# Patient Record
Sex: Female | Born: 1950 | Race: Black or African American | Hispanic: No | Marital: Married | Smoking: Former smoker
Health system: Southern US, Community
[De-identification: ages and names within clinical notes are randomized; demographics above are authoritative.]

## PROBLEM LIST (undated history)

## (undated) DIAGNOSIS — C50912 Malignant neoplasm of unspecified site of left female breast: Secondary | ICD-10-CM

## (undated) DIAGNOSIS — F609 Personality disorder, unspecified: Secondary | ICD-10-CM

## (undated) DIAGNOSIS — G8929 Other chronic pain: Secondary | ICD-10-CM

## (undated) DIAGNOSIS — M549 Dorsalgia, unspecified: Secondary | ICD-10-CM

## (undated) DIAGNOSIS — I10 Essential (primary) hypertension: Secondary | ICD-10-CM

## (undated) HISTORY — PX: CHOLECYSTECTOMY: SHX55

## (undated) HISTORY — PX: ABDOMINAL HYSTERECTOMY: SHX81

## (undated) HISTORY — PX: OTHER SURGICAL HISTORY: SHX169

## (undated) HISTORY — PX: BACK SURGERY: SHX140

---

## 2013-04-02 ENCOUNTER — Emergency Department (HOSPITAL_BASED_OUTPATIENT_CLINIC_OR_DEPARTMENT_OTHER)
Admission: EM | Admit: 2013-04-02 | Discharge: 2013-04-02 | Disposition: A | Discharge status: Home / Self Care | Payer: Non-veteran care | Attending: Emergency Medicine | Admitting: Emergency Medicine

## 2013-04-02 ENCOUNTER — Encounter (HOSPITAL_BASED_OUTPATIENT_CLINIC_OR_DEPARTMENT_OTHER): Payer: Self-pay | Admitting: *Deleted

## 2013-04-02 DIAGNOSIS — Z87891 Personal history of nicotine dependence: Secondary | ICD-10-CM | POA: Insufficient documentation

## 2013-04-02 DIAGNOSIS — Z880 Allergy status to penicillin: Secondary | ICD-10-CM | POA: Insufficient documentation

## 2013-04-02 DIAGNOSIS — G8929 Other chronic pain: Secondary | ICD-10-CM | POA: Insufficient documentation

## 2013-04-02 DIAGNOSIS — R350 Frequency of micturition: Secondary | ICD-10-CM | POA: Insufficient documentation

## 2013-04-02 DIAGNOSIS — I10 Essential (primary) hypertension: Secondary | ICD-10-CM | POA: Insufficient documentation

## 2013-04-02 DIAGNOSIS — Z79899 Other long term (current) drug therapy: Secondary | ICD-10-CM | POA: Insufficient documentation

## 2013-04-02 DIAGNOSIS — M549 Dorsalgia, unspecified: Secondary | ICD-10-CM

## 2013-04-02 DIAGNOSIS — M545 Low back pain, unspecified: Secondary | ICD-10-CM | POA: Insufficient documentation

## 2013-04-02 DIAGNOSIS — R300 Dysuria: Secondary | ICD-10-CM | POA: Insufficient documentation

## 2013-04-02 HISTORY — DX: Other chronic pain: G89.29

## 2013-04-02 HISTORY — DX: Dorsalgia, unspecified: M54.9

## 2013-04-02 HISTORY — DX: Essential (primary) hypertension: I10

## 2013-04-02 LAB — URINALYSIS, ROUTINE W REFLEX MICROSCOPIC
Glucose, UA: NEGATIVE mg/dL
Ketones, ur: 15 mg/dL — AB
Leukocytes, UA: NEGATIVE
Protein, ur: 30 mg/dL — AB

## 2013-04-02 LAB — BASIC METABOLIC PANEL
CO2: 29 mEq/L (ref 19–32)
Calcium: 10 mg/dL (ref 8.4–10.5)
Glucose, Bld: 104 mg/dL — ABNORMAL HIGH (ref 70–99)
Potassium: 3.3 mEq/L — ABNORMAL LOW (ref 3.5–5.1)
Sodium: 142 mEq/L (ref 135–145)

## 2013-04-02 MED ORDER — HYDROCODONE-ACETAMINOPHEN 5-325 MG PO TABS
2.0000 | ORAL_TABLET | Freq: Once | ORAL | Status: AC
  Administered 2013-04-02: 2 via ORAL
  Filled 2013-04-02: qty 2

## 2013-04-02 MED ORDER — HYDROCODONE-ACETAMINOPHEN 5-325 MG PO TABS: 2.0000 | ORAL_TABLET | ORAL | Status: DC | PRN

## 2013-04-02 NOTE — ED Notes (Signed)
Pt amb to room 9 with quick steady gait in nad. Pt reports 10 days of low back pain, frequency, urgency, and pelvic "pressure". Denies fevers at home.

## 2013-04-02 NOTE — ED Provider Notes (Signed)
Medical screening examination/treatment/procedure(s) were performed by non-physician practitioner and as supervising physician I was immediately available for consultation/collaboration.   Shira Bobst, MD 04/02/13 2320 

## 2013-04-02 NOTE — ED Provider Notes (Signed)
CSN: [id number]     Arrival date & time 04/02/13  1641 History     First MD Initiated Contact with Patient 04/02/13 1700     Chief Complaint  Patient presents with  . Flank Pain   (Consider location/radiation/quality/duration/timing/severity/associated sxs/prior Treatment) Patient is a 62 y.o. female presenting with dysuria. The history is provided by the patient. No language interpreter was used.  Dysuria Pain quality:  Aching Pain severity:  Moderate Progression:  Worsening Relieved by:  Nothing Worsened by:  Nothing tried Ineffective treatments:  None tried Pt complains of low back pain.  Pt reports urinary pressure and frequency  Past Medical History  Diagnosis Date  . Chronic back pain   . Hypertension    History reviewed. No pertinent past surgical history. History reviewed. No pertinent family history. History  Substance Use Topics  . Smoking status: Former Games developer  . Smokeless tobacco: Not on file  . Alcohol Use: Not on file   OB History   Grav Para Term Preterm Abortions TAB SAB Ect Mult Living                 Review of Systems  Genitourinary: Positive for dysuria.  All other systems reviewed and are negative.    Allergies  Penicillins  Home Medications   Current Outpatient Rx  Name  Route  Sig  Dispense  Refill  . ARIPiprazole (ABILIFY) 10 MG tablet   Oral   Take 10 mg by mouth daily.         . bisacodyl (BISACODYL) 5 MG EC tablet   Oral   Take 5 mg by mouth daily as needed for constipation.         . chlorpheniramine-phenylephrine (RYNATAN) 4.5-5 MG/5ML SUSP   Oral   Take by mouth every 12 (twelve) hours as needed.         . docusate sodium (COLACE) 100 MG capsule   Oral   Take 100 mg by mouth 2 (two) times daily.         . hydrochlorothiazide (HYDRODIURIL) 25 MG tablet   Oral   Take 25 mg by mouth daily.         Marland Kitchen ibuprofen (ADVIL,MOTRIN) 800 MG tablet   Oral   Take 800 mg by mouth every 8 (eight) hours as needed for  pain.         . meclizine (ANTIVERT) 25 MG tablet   Oral   Take 12.5 mg by mouth 3 (three) times daily as needed.         Marland Kitchen oxyCODONE-acetaminophen (PERCOCET/ROXICET) 5-325 MG per tablet   Oral   Take 1 tablet by mouth every 6 (six) hours as needed for pain.         . Probiotic Product (SOLUBLE FIBER/PROBIOTICS PO)   Oral   Take 2 capsules by mouth daily.         . ranitidine (ZANTAC) 150 MG capsule   Oral   Take 150 mg by mouth every evening.         . simvastatin (ZOCOR) 20 MG tablet   Oral   Take 20 mg by mouth every evening.          BP 145/88  Pulse 78  Temp(Src) 98 F (36.7 C) (Oral)  Resp 18  Ht 5' 5.5" (1.664 m)  Wt 220 lb (99.791 kg)  BMI 36.04 kg/m2  SpO2 98% Physical Exam  Nursing note and vitals reviewed. Constitutional: She is oriented to person, place, and time. She  appears well-developed and well-nourished.  HENT:  Head: Normocephalic.  Right Ear: External ear normal.  Left Ear: External ear normal.  Eyes: Conjunctivae are normal. Pupils are equal, round, and reactive to light.  Neck: Normal range of motion. Neck supple.  Cardiovascular: Normal rate and normal heart sounds.   Pulmonary/Chest: Effort normal.  Abdominal: Soft.  Musculoskeletal: Normal range of motion.  Neurological: She is alert and oriented to person, place, and time.  Skin: Skin is warm.  Psychiatric: She has a normal mood and affect.    ED Course   Procedures (including critical care time)  Labs Reviewed  URINALYSIS, ROUTINE W REFLEX MICROSCOPIC - Abnormal; Notable for the following:    Color, Urine AMBER (*)    APPearance CLOUDY (*)    Specific Gravity, Urine 1.045 (*)    Bilirubin Urine SMALL (*)    Ketones, ur 15 (*)    Protein, ur 30 (*)    All other components within normal limits  URINE MICROSCOPIC-ADD ON - Abnormal; Notable for the following:    Squamous Epithelial / LPF FEW (*)    All other components within normal limits   No results found. 1.  Back pain     MDM  Pt has protein in urine.  Bun and creat normal.   Pt advised follow up with primary.   Pt given rx for back pain   Elson Areas, PA-C 04/02/13 2302

## 2014-03-16 HISTORY — PX: BREAST LUMPECTOMY WITH AXILLARY LYMPH NODE BIOPSY: SHX5593

## 2014-04-27 ENCOUNTER — Telehealth: Payer: Self-pay | Admitting: *Deleted

## 2014-04-27 NOTE — Telephone Encounter (Signed)
Left message for pt to return my call so I can schedule a Med Onc appt w/ her.

## 2014-04-27 NOTE — Telephone Encounter (Signed)
Pt returned my call and I confirmed 05/17/14 appt w/ pt.  Mailed before appt letter, welcoming packet, calendar and intake form to pt.  Took paperwork to HIM to create chart.

## 2014-05-07 ENCOUNTER — Encounter: Payer: Self-pay | Admitting: *Deleted

## 2014-05-07 NOTE — Progress Notes (Signed)
Completed chart, added to spreadsheet and placed in Dr. Geralyn Flash box.

## 2014-05-17 ENCOUNTER — Encounter: Payer: Self-pay | Admitting: Hematology and Oncology

## 2014-05-17 ENCOUNTER — Ambulatory Visit: Payer: Non-veteran care

## 2014-05-17 ENCOUNTER — Ambulatory Visit (HOSPITAL_BASED_OUTPATIENT_CLINIC_OR_DEPARTMENT_OTHER): Payer: Non-veteran care | Admitting: Hematology and Oncology

## 2014-05-17 VITALS — BP 149/80 | HR 74 | Temp 98.2°F | Resp 19 | Ht 65.0 in | Wt 203.4 lb

## 2014-05-17 DIAGNOSIS — C50512 Malignant neoplasm of lower-outer quadrant of left female breast: Secondary | ICD-10-CM

## 2014-05-17 DIAGNOSIS — Z170 Estrogen receptor positive status [ER+]: Secondary | ICD-10-CM

## 2014-05-17 DIAGNOSIS — C50519 Malignant neoplasm of lower-outer quadrant of unspecified female breast: Secondary | ICD-10-CM

## 2014-05-17 DIAGNOSIS — N951 Menopausal and female climacteric states: Secondary | ICD-10-CM | POA: Insufficient documentation

## 2014-05-17 NOTE — Progress Notes (Signed)
Checked in new pt with no financial concerns at this time.  Informed pt of the [facility] Schein and gave her a flyer on what they assist with.  Pt has Raquel's card for any future questions or concerns.

## 2014-05-17 NOTE — Progress Notes (Signed)
New patient intake form sent to scan.   

## 2014-05-17 NOTE — Progress Notes (Signed)
[facility] CONSULT NOTE  Patient Care Team: Niger Reid, MD as PCP - General (Internal Medicine)  CHIEF COMPLAINTS/PURPOSE OF CONSULTATION:  Newly diagnosed breast cancer  HISTORY OF PRESENTING ILLNESS:  Sandra Costa 63 y.o. 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 is here because of recent diagnosis of left breast cancer. She was being followed at the [facility] in [place]. She had a screening mammogram done 07/14/2003 showed asymmetries in the left breast that was incompletely characterized. A followup mammogram ultrasound 08/05/2013 showed a 9 mm hypoechoic mass in the upper inner quadrant of left breast. 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 In the interim she had gallstones for which she underwent cholecystectomy on 03/09/2014. Her lumpectomy was done on 03/26/2014 that revealed a tumor measuring 0.5 cm with negative surgical margins, one sentinel lymph node and 2 non-sentinel lymph nodes were negative. ER 90% PR 50% HER-2/neu negative by IHC 1+. Patient sought medical oncology and radiation oncology at the [facility] but she elected to get treatment closer to home so she is seeing [place] today. Her breast has healed very well from the surgery. She denies any new problems or concerns. She is somewhat reluctant to go on any further treatments. She wonders what her risks are without further treatment.  I reviewed her records extensively and collaborated the history with the patient.  SUMMARY OF ONCOLOGIC HISTORY:   Breast cancer of lower-outer quadrant of left female breast   01/27/2014 Mammogram 9 mm hypoechoic mass upper-inner quadrant of left breast   03/26/2014 Surgery Left breast lumpectomy 0.5 cm tumor 3 lymph nodes negative ER 90% PR 50% HER-2 negative (at [facility])    In terms of  breast cancer risk profile:  She menarched at early age of 69 and went to menopause at age unknown because she had hysterectomy  She had 2 pregnancy, her first child was born at age 65  She never received birth control pills.  She was never exposed to fertility medications or hormone replacement therapy.  She has extensive family history of Breast/GYN/GI cancer Her sister was diagnosed with breast cancer at age 42 died within one year Sr. with breast cancer diagnosed at age 55 treated with a recurrence within 2 years Mother with breast cancer diagnosed in 52s treated for brain metastases and recurrence Grandmother with breast cancer diagnosed less than age 14  MEDICAL HISTORY:  Past Medical History  Diagnosis Date  . Chronic back pain   . Hypertension     SURGICAL HISTORY: Past Surgical History  Procedure Laterality Date  . Back surgery    . Abdominal hysterectomy    . Cholecystectomy      SOCIAL HISTORY: History   Social History  . Marital Status: Married    Spouse Name: N/A    Number of Children: N/A  . Years of Education: N/A   Occupational History  . Not on file.   Social History Main Topics  . Smoking status: Former Smoker -- 0.25 packs/day    Types: Cigarettes  . Smokeless tobacco: Never Used  . Alcohol Use: No  . Drug Use: No  . Sexual Activity: Not on file   Other Topics Concern  . Not on file   Social History Narrative  . No narrative on file    FAMILY HISTORY: Family History  Problem  Relation Age of Onset  . Cancer Mother   . Cancer Sister   . Cancer Maternal Grandmother   . Cancer Sister     ALLERGIES:  is allergic to morphine and related; penicillins; sulfa antibiotics; and tape.  MEDICATIONS:  Current Outpatient Prescriptions  Medication Sig Dispense Refill  . ARIPiprazole (ABILIFY) 10 MG tablet Take 30 mg by mouth daily. Take 1/2 tab daily      . aspirin 81 MG tablet Take 81 mg by mouth daily.      . hydrochlorothiazide (HYDRODIURIL)  25 MG tablet Take 12.5 mg by mouth daily.       Marland Kitchen oxyCODONE-acetaminophen (PERCOCET/ROXICET) 5-325 MG per tablet Take 1 tablet by mouth every 6 (six) hours as needed for pain.      . potassium chloride SA (K-DUR,KLOR-CON) 20 MEQ tablet Take 20 mEq by mouth daily.      . simvastatin (ZOCOR) 20 MG tablet Take 20 mg by mouth every evening.       No current facility-administered medications for this visit.    REVIEW OF SYSTEMS:   Constitutional: Denies fevers, chills or abnormal night sweats Eyes: Denies blurriness of vision, double vision or watery eyes Ears, nose, mouth, throat, and face: Denies mucositis or sore throat Respiratory: Denies cough, dyspnea or wheezes Cardiovascular: Denies palpitation, chest discomfort or lower extremity swelling Gastrointestinal:  Denies nausea, heartburn or change in bowel habits Skin: Denies abnormal skin rashes Lymphatics: Denies new lymphadenopathy or easy bruising Neurological:Denies numbness, tingling or new weaknesses Behavioral/Psych: Mood is stable, no new changes  Breast:  Denies any palpable lumps or discharge, breasts tenderness along the surgical areas All other systems were reviewed with the patient and are negative.  PHYSICAL EXAMINATION: ECOG PERFORMANCE STATUS: 1 - Symptomatic but completely ambulatory  Filed Vitals:   05/17/14 1447  BP: 149/80  Pulse: 74  Temp: 98.2 F (36.8 C)  Resp: 19   Filed Weights   05/17/14 1447  Weight: 203 lb 6.4 oz (92.262 kg)    GENERAL:alert, no distress and comfortable SKIN: skin color, texture, turgor are normal, no rashes or significant lesions EYES: normal, conjunctiva are pink and non-injected, sclera clear OROPHARYNX:no exudate, no erythema and lips, buccal mucosa, and tongue normal  NECK: supple, thyroid normal size, non-tender, without nodularity LYMPH:  no palpable lymphadenopathy in the cervical, axillary or inguinal LUNGS: clear to auscultation and percussion with normal breathing  effort HEART: regular rate & rhythm and no murmurs and no lower extremity edema ABDOMEN:abdomen soft, non-tender and normal bowel sounds Musculoskeletal:no cyanosis of digits and no clubbing  PSYCH: alert & oriented x 3 with fluent speech NEURO: no focal motor/sensory deficits BREAST: No palpable nodules in breast. No palpable axillary or supraclavicular lymphadenopathy. The scars are well healed.  LABORATORY DATA:  I have reviewed the data as listed No results found for this basename: WBC,  HGB,  HCT,  MCV,  PLT   Lab Results  Component Value Date   NA 142 04/02/2013   K 3.3* 04/02/2013   CL 103 04/02/2013   CO2 29 04/02/2013    RADIOGRAPHIC STUDIES: I have personally reviewed the radiological reports and agreed with the findings in the report.  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 There is no indication for systemic chemotherapy but then referred her to see radiation oncology regarding the risks and benefits of undergoing radiation since she had lumpectomy. I also recommended antiestrogen therapy with either tamoxifen or aromatase inhibitors. I would like to obtain a bone density test to evaluate for osteoporosis. If she does not have osteoporosis then I would consider treating her with aromatase inhibitor. If she has significant osteoporosis then we would use tamoxifen.  We discussed the risks and benefits of anti-estrogen therapy with aromatase inhibitors. These include but not limited to insomnia, hot flashes, mood changes, vaginal dryness, bone density loss, and weight gain. Although rare, serious side effects risk of blood clots were also discussed. We  strongly believe that the benefits far outweigh the risks. Patient understands these risks and consented to starting treatment. Planned treatment duration is 5 years. Return to clinic in 2 months to discuss treatment plan. She was provided with literature on letrozole and tamoxifen. Final decision on the broad based upon the bone density test results.  We will need to discuss if she had genetic counseling and testing done at [facility]. If she did not then we will need to consult genetics counseling.     All questions were answered. The patient knows to call the clinic with any problems, questions or concerns. I spent 40 minutes counseling the patient face to face. The total time spent in the appointment was 60 minutes and more than 50% was on counseling.     Rulon Eisenmenger, MD 05/17/2014 5:44 PM

## 2014-05-17 NOTE — Assessment & Plan Note (Signed)
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 There is no indication for systemic chemotherapy but then referred her to see radiation oncology regarding the risks and benefits of undergoing radiation since she had lumpectomy. I also recommended antiestrogen therapy with either tamoxifen or aromatase inhibitors. I would like to obtain a bone density test to evaluate for osteoporosis. If she does not have osteoporosis then I would consider treating her with aromatase inhibitor. If she has significant osteoporosis then we would use tamoxifen.  We discussed the risks and benefits of anti-estrogen therapy with aromatase inhibitors. These include but not limited to insomnia, hot flashes, mood changes, vaginal dryness, bone density loss, and weight gain. Although rare, serious side effects risk of blood clots were also discussed. We strongly believe that the benefits far outweigh the risks. Patient understands these risks and consented to starting treatment. Planned treatment duration is 5 years. Return to clinic in 2 months to discuss treatment plan. She was provided with literature on letrozole and tamoxifen. Final decision on the broad based upon the bone density test results.  We will need to discuss if she had genetic counseling and testing done at [facility]. If she did not then we will need to consult genetics counseling.

## 2014-05-19 ENCOUNTER — Telehealth: Payer: Self-pay | Admitting: Hematology and Oncology

## 2014-05-19 NOTE — Telephone Encounter (Signed)
, °

## 2014-05-25 ENCOUNTER — Telehealth: Payer: Self-pay | Admitting: *Deleted

## 2014-05-25 NOTE — Telephone Encounter (Signed)
Patient's chart from [place] received. Copy to Dr. Lindi Adie, original to HIM to be scanned.

## 2014-05-27 ENCOUNTER — Other Ambulatory Visit: Payer: Non-veteran care

## 2014-05-28 ENCOUNTER — Telehealth: Payer: Self-pay

## 2014-05-28 ENCOUNTER — Encounter: Payer: Self-pay | Admitting: Radiation Oncology

## 2014-05-28 NOTE — Telephone Encounter (Signed)
[place] form is complete.  Let pt know there is part of the form she needs to complete.  She can pick up when she is at clinic on 10/7 or I can mail to her.  Pt to call to let me know.

## 2014-05-28 NOTE — Telephone Encounter (Signed)
Sent to scan

## 2014-05-28 NOTE — Telephone Encounter (Signed)
Per pt request, mailed [employer] disability form to patient.

## 2014-05-28 NOTE — Progress Notes (Signed)
Location of Breast Cancer: lower outer quadrant of left breast  Histology per Pathology Report:   03/30/14     Receptor Status: ER(90% +), PR (50% +), Her2-neu (negative)  Did patient present with symptoms (if so, please note symptoms) or was this found on screening mammography?: screening mammogram  Past/Anticipated interventions by surgeon, if any: 03/10/14 left breast biopsy, 03/26/14 - left breast lumpectomy with needle localization with sentinal lymph node biopsy at the [facility] in [place].  Past/Anticipated interventions by medical oncology, if any: antiestrogen therapy with either tamoxifen or aromatase inhibitors for five years - sees Dr. Lindi Adie.  Lymphedema issues, if any:  no  Pain issues, if any:  yes chronic pain in her lower back - rates at a 5/10.  She takes percocet 1-2 times per day.   GYN history:She menarched at early age of 53 and went to menopause at age unknown because she had hysterectomy. She had 2 pregnancys, her first child was born at age 35. She took bcp for a short period of time.    SAFETY ISSUES:  Prior radiation? no  Pacemaker/ICD? no  Possible current pregnancy?no  Is the patient on methotrexate? no  Current Complaints / other details:  Patient is here with her husband and daughter.  She is having a dexa scan on 06/08/14.    Jacqulyn Liner, RN 05/28/2014,11:36 AM

## 2014-06-02 ENCOUNTER — Ambulatory Visit
Admission: RE | Admit: 2014-06-02 | Discharge: 2014-06-02 | Disposition: A | Discharge status: Home / Self Care | Payer: Non-veteran care | Source: Ambulatory Visit | Attending: Radiation Oncology | Admitting: Radiation Oncology

## 2014-06-02 ENCOUNTER — Encounter: Payer: Self-pay | Admitting: Radiation Oncology

## 2014-06-02 VITALS — BP 140/78 | HR 78 | Temp 98.8°F | Resp 12 | Ht 64.5 in | Wt 201.9 lb

## 2014-06-02 DIAGNOSIS — I10 Essential (primary) hypertension: Secondary | ICD-10-CM | POA: Insufficient documentation

## 2014-06-02 DIAGNOSIS — C50512 Malignant neoplasm of lower-outer quadrant of left female breast: Secondary | ICD-10-CM

## 2014-06-02 DIAGNOSIS — Z170 Estrogen receptor positive status [ER+]: Secondary | ICD-10-CM | POA: Insufficient documentation

## 2014-06-02 DIAGNOSIS — Z803 Family history of malignant neoplasm of breast: Secondary | ICD-10-CM | POA: Insufficient documentation

## 2014-06-02 DIAGNOSIS — G8929 Other chronic pain: Secondary | ICD-10-CM | POA: Insufficient documentation

## 2014-06-02 DIAGNOSIS — Z87891 Personal history of nicotine dependence: Secondary | ICD-10-CM | POA: Insufficient documentation

## 2014-06-02 DIAGNOSIS — F609 Personality disorder, unspecified: Secondary | ICD-10-CM | POA: Insufficient documentation

## 2014-06-02 DIAGNOSIS — Z79899 Other long term (current) drug therapy: Secondary | ICD-10-CM | POA: Insufficient documentation

## 2014-06-02 DIAGNOSIS — M545 Low back pain: Secondary | ICD-10-CM | POA: Insufficient documentation

## 2014-06-02 DIAGNOSIS — Z7982 Long term (current) use of aspirin: Secondary | ICD-10-CM | POA: Insufficient documentation

## 2014-06-02 HISTORY — DX: Personality disorder, unspecified: F60.9

## 2014-06-02 HISTORY — DX: Malignant neoplasm of unspecified site of left female breast: C50.912

## 2014-06-02 NOTE — Progress Notes (Signed)
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 female who is een out of the courtesy of Dr. Lindi Adie for an opinion concerning radiation therapy as part of management of patient's recently diagnosed stage I left breast cancer. The patient is a veteran of [employer] and has been undergoing routine medical care at the [facility] in [place]. A screening mammogram last year showed some asymmetries and a 6 month interval followup was recommended. A repeat mammogram as well as ultrasound was performed which showed a 9 mm  hypoechoic mass in the left breast. Patient underwent biopsy which revealed a grade 3 invasive ductal carcinoma with some associated DCIS. Patient proceeded to undergo lumpectomy and sentinel node procedure on July 31 which revealed a 0.5 cm tumor with clear margins. Total of 3 benign lymph nodes were recovered at the time of the patient's sentinel node procedure. Tumor was ER and PR positive, HER-2/neu negative. Patient was seen by medical oncology who has recommended adjuvant hormonal therapy with no recommendation for chemotherapy. Patient is now seen in radiation oncology for evaluation and consideration for radiation therapy as breast conserving therapy.  PREVIOUS RADIATION THERAPY: No  PAST MEDICAL HISTORY:  has a past medical history of Chronic back pain; Hypertension; Breast cancer, left breast; and Personality disorder.    PAST SURGICAL HISTORY: Past Surgical History  Procedure Laterality Date  . Back surgery    . Abdominal hysterectomy    . Cholecystectomy    . Apendectomy    . Breast lumpectomy  with axillary lymph node biopsy Left 03/16/14    FAMILY HISTORY: family history includes Breast cancer in her maternal grandmother, mother, sister, and sister.  SOCIAL HISTORY:  reports that she quit smoking about 5 years ago. Her smoking use included Cigarettes. She has a 6.25 pack-year smoking history. She has never used smokeless tobacco. She reports that she does not drink alcohol or use illicit drugs.  ALLERGIES: Morphine and related; Penicillins; Sulfa antibiotics; and Tape  MEDICATIONS:  Current Outpatient Prescriptions  Medication Sig Dispense Refill  . ARIPiprazole (ABILIFY) 10 MG tablet Take 30 mg by mouth daily. Take 1/2 tab daily      . aspirin 81 MG tablet Take 81 mg by mouth daily.      . hydrochlorothiazide (HYDRODIURIL) 25 MG tablet Take 12.5 mg by mouth daily.       Marland Kitchen oxyCODONE-acetaminophen (PERCOCET/ROXICET) 5-325 MG per tablet Take 1 tablet by mouth every 6 (six) hours as needed for pain.      . potassium chloride SA (K-DUR,KLOR-CON) 20 MEQ tablet Take 20 mEq by mouth daily.      . simvastatin (ZOCOR) 20 MG tablet Take 20 mg by mouth every evening.       No current facility-administered medications for this encounter.    REVIEW OF SYSTEMS:  A 15 point review of systems is documented in the electronic medical record. This was obtained by the nursing staff. However, I reviewed this with the patient to discuss relevant findings and make appropriate changes.  She has some low back pain and will undergo bone scan early next week to complete her  staging workup. Patient denies any pain within left breast or axillary region since her surgery. She denies any problems with swelling in her left arm or hand. Patient's left nipple is chronically inverted.  She denies any headaches.   PHYSICAL EXAM:  height is 5' 4.5" (1.638 m) and weight is 201 lb 14.4 oz (91.581 kg). Her oral temperature is 98.8 F (37.1 C). Her blood pressure is 140/78 and her pulse is 78. Her respiration is 12.     BP 140/78  Pulse 78  Temp(Src) 98.8 F (37.1 C) (Oral)  Resp 12  Ht 5' 4.5" (1.638 m)  Wt 201 lb 14.4 oz (91.581 kg)  BMI 34.13 kg/m2  General Appearance:    Alert, cooperative, no distress, appears stated age, accompanied by her husband and daughter. The patient has somewhat of a flat affect   Head:    Normocephalic, without obvious abnormality, atraumatic  Eyes:    PERRL, conjunctiva/corneas clear, EOM's intact,         Nose:   Nares normal, septum midline, mucosa normal, no drainage    or sinus tenderness  Throat:   Lips, mucosa, and tongue normal; teeth-several missing,  gums normal  Neck:   Supple, symmetrical, trachea midline, no adenopathy;    thyroid:  no enlargement/tenderness/nodules; no carotid   bruit or JVD  Back:     Symmetric, no curvature, ROM normal, no CVA tenderness  Lungs:     Clear to auscultation bilaterally, respirations unlabored  Chest Wall:    No tenderness or deformity   Heart:    Regular rate and rhythm, S1 and S2 normal, no murmur, rub   or gallop  Breast Exam:    Right breast-No tenderness, masses, or nipple abnormality, the left breast shows a well-healed lumpectomy scar in the 2:30 position. The patient has a separate scar in the axillary area from her senitel node procedure which is also well healed. No dominant mass appreciated breast nipple discharge or bleeding. The left nipple is inverted.   Abdomen:     Soft, non-tender, bowel sounds active all four quadrants,    no masses, no organomegaly        Extremities:   Extremities normal, atraumatic, no cyanosis or edema  Pulses:   2+ and symmetric all extremities  Skin:   Skin color, texture, turgor normal, no rashes or lesions  Lymph nodes:   Cervical, supraclavicular, and axillary nodes normal  Neurologic:    normal strength, sensation and reflexes    throughout     ECOG = 0  0 - Asymptomatic (Fully active, able to carry on all predisease activities without restriction)  LABORATORY DATA:   No results found for this basename: WBC, HGB, HCT, MCV, PLT, NEUTROABS   Lab Results  Component Value Date   NA 142 04/02/2013   K 3.3* 04/02/2013   CL 103 04/02/2013   CO2 29 04/02/2013   GLUCOSE 104* 04/02/2013   CREATININE 0.80 04/02/2013   CALCIUM 10.0 04/02/2013      RADIOGRAPHY: No results found.    IMPRESSION: Stage I invasive ductal carcinoma of the left breast. Patient would be a good candidate for breast conservation with radiation therapy directed at the left breast. I discussed the treatment course side effects and potential toxicities of radiation therapy in this situation with the patient and her family. She appears to understand and wishes to proceed with planned course of treatment. The patient will proceed with hypo- fractionated accelerated treatment if technically possible.  PLAN:  Simulation and planning on October 13 with treatments to begin soon afterwards   ------------------------------------------------  Blair Promise, PhD, MD

## 2014-06-02 NOTE — Progress Notes (Signed)
Please see the Nurse Progress Note in the MD Initial Consult Encounter for this patient. 

## 2014-06-03 ENCOUNTER — Encounter: Payer: Self-pay | Admitting: *Deleted

## 2014-06-03 NOTE — Progress Notes (Signed)
[place] Psychosocial Distress Screening Clinical Social Work  Clinical Social Work was referred by distress screening protocol.  The patient scored a 8 on the Psychosocial Distress Thermometer which indicates severe distress. Clinical Social Worker phoned pt to assess for distress and other psychosocial needs. Pt not available, CSW left message and awaits return call.  ONCBCN DISTRESS SCREENING 06/02/2014  Screening Type Initial Screening  Elta Gu[place] the number that describes how much distress you have been experiencing in the past week 8  Emotional problem type Nervousness/Anxiety  Physical Problem type Getting around;Bathing/dressing  Physician notified of physical symptoms Yes   Clinical Social Worker follow up needed: No.  If yes, follow up plan: Loren Racer, [place]  [facility] Phone: [phone] Fax: [phone]

## 2014-06-07 ENCOUNTER — Ambulatory Visit
Admission: RE | Admit: 2014-06-07 | Discharge: 2014-06-07 | Disposition: A | Discharge status: Home / Self Care | Payer: Non-veteran care | Source: Ambulatory Visit | Attending: Radiation Oncology | Admitting: Radiation Oncology

## 2014-06-07 DIAGNOSIS — C50512 Malignant neoplasm of lower-outer quadrant of left female breast: Secondary | ICD-10-CM

## 2014-06-07 NOTE — Progress Notes (Signed)
  Radiation Oncology         [phone]) [phone] ________________________________  Name: Sandra Costa MRN: [id number]  Date: 06/07/2014  DOB: [birth year]  RESPIRATORY MOTION MANAGEMENT SIMULATION  NARRATIVE:  In order to account for effect of respiratory motion on target structures and other organs in the planning and delivery of radiotherapy, this patient underwent respiratory motion management simulation.  To accomplish this, when the patient was brought to the CT simulation planning suite, 4D respiratoy motion management CT images were obtained.  The CT images were loaded into the planning software.  Then, using a variety of tools including Cine, MIP, and standard views, the target volume and planning target volumes (PTV) were delineated.  Avoidance structures were contoured.  Treatment planning then occurred.  Dose volume histograms were generated and reviewed for each of the requested structure.  The resulting plan was carefully reviewed and approved today.  Blair Promise M.D.

## 2014-06-07 NOTE — Progress Notes (Signed)
  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  Identity was confirmed.  All relevant records and images related to the planned course of therapy were reviewed.  The patient freely provided informed written consent to proceed with treatment after reviewing the details related to the planned course of therapy. The consent form was witnessed and verified by the simulation staff.  Then, the patient was set-up in a stable reproducible  supine position for radiation therapy.  CT images were obtained.  Surface markings were placed.  The CT images were loaded into the planning software.  Then the target and avoidance structures were contoured.  Treatment planning then occurred.  The radiation prescription was entered and confirmed.  Then, I designed and supervised the construction of a total of 3 medically necessary complex treatment devices.  I have requested : 3D Simulation  I have requested a DVH of the following structures: heart, lungs, lumpectomy cavity.  I have ordered:dose calc.  PLAN:  The patient will receive 50.4 Gy in 28 fractions followed by a boost to the lumpectomy cavity of 10 gray in a cumulutive dose of 60.4 gray. Due to the significant separation at the  Isocenter of the breast field, the patient would not be a candidate for hypo- fractionated accelerated radiation therapy.  ________________________________  -----------------------------------  Blair Promise, PhD, MD

## 2014-06-07 NOTE — Progress Notes (Signed)
  Radiation Oncology         [phone]) [phone] ________________________________  Name: Sandra Costa MRN: [id number]  Date: 06/07/2014  DOB: 1951/08/13  Optical Surface Tracking Plan:  Since intensity modulated radiotherapy (IMRT) and 3D conformal radiation treatment methods are predicated on accurate and precise positioning for treatment, intrafraction motion monitoring is medically necessary to ensure accurate and safe treatment delivery.  The ability to quantify intrafraction motion without excessive ionizing radiation dose can only be performed with optical surface tracking. Accordingly, surface imaging offers the opportunity to obtain 3D measurements of patient position throughout IMRT and 3D treatments without excessive radiation exposure.  I am ordering optical surface tracking for this patient's upcoming course of radiotherapy. ________________________________  Blair Promise, MD 06/07/2014 6:42 PM    Reference:   Particia Jasper, et al. Surface imaging-based analysis of intrafraction motion for breast radiotherapy patients.Journal of [place], n. 6, nov. 2014. ISSN [id number].   Available at: <http://www.jacmp.org/index.php/jacmp/article/view/4957>.

## 2014-06-08 ENCOUNTER — Ambulatory Visit
Admission: RE | Admit: 2014-06-08 | Discharge: 2014-06-08 | Disposition: A | Discharge status: Home / Self Care | Payer: Non-veteran care | Source: Ambulatory Visit | Attending: Hematology and Oncology | Admitting: Hematology and Oncology

## 2014-06-08 DIAGNOSIS — N951 Menopausal and female climacteric states: Secondary | ICD-10-CM

## 2014-06-09 ENCOUNTER — Encounter: Payer: Self-pay | Admitting: Radiation Oncology

## 2014-06-14 ENCOUNTER — Ambulatory Visit
Admission: RE | Admit: 2014-06-14 | Discharge: 2014-06-14 | Disposition: A | Discharge status: Home / Self Care | Payer: Non-veteran care | Source: Ambulatory Visit | Attending: Radiation Oncology | Admitting: Radiation Oncology

## 2014-06-14 DIAGNOSIS — C50512 Malignant neoplasm of lower-outer quadrant of left female breast: Secondary | ICD-10-CM

## 2014-06-14 NOTE — Progress Notes (Signed)
  Radiation Oncology         [phone] ________________________________  Name: Sandra Costa MRN: [id number]  Date: 06/14/2014  DOB: Jan 30, 1951  Simulation Verification Note    ICD-9-CM ICD-10-CM  1. Breast cancer of lower-outer quadrant of left female breast 174.5 C50.512    Status: outpatient  NARRATIVE: The patient was brought to the treatment unit and placed in the planned treatment position. The clinical setup was verified. Then port films were obtained and uploaded to the radiation oncology medical record software.  The treatment beams were carefully compared against the planned radiation fields. The position location and shape of the radiation fields was reviewed. They targeted volume of tissue appears to be appropriately covered by the radiation beams. Organs at risk appear to be excluded as planned.  Based on my personal review, I approved the simulation verification. The patient's treatment will proceed as planned.  -----------------------------------  Blair Promise, PhD, MD

## 2014-06-15 ENCOUNTER — Encounter: Payer: Self-pay | Admitting: Radiation Oncology

## 2014-06-15 ENCOUNTER — Ambulatory Visit
Admission: RE | Admit: 2014-06-15 | Discharge: 2014-06-15 | Disposition: A | Discharge status: Home / Self Care | Payer: Non-veteran care | Source: Ambulatory Visit | Attending: Radiation Oncology | Admitting: Radiation Oncology

## 2014-06-15 VITALS — BP 121/65 | HR 75 | Temp 98.3°F | Ht 64.5 in | Wt 200.1 lb

## 2014-06-15 DIAGNOSIS — C50512 Malignant neoplasm of lower-outer quadrant of left female breast: Secondary | ICD-10-CM | POA: Insufficient documentation

## 2014-06-15 DIAGNOSIS — Z170 Estrogen receptor positive status [ER+]: Secondary | ICD-10-CM | POA: Insufficient documentation

## 2014-06-15 DIAGNOSIS — M545 Low back pain: Secondary | ICD-10-CM | POA: Insufficient documentation

## 2014-06-15 MED ORDER — ALRA NON-METALLIC DEODORANT (RAD-ONC)
1.0000 "application " | Freq: Once | TOPICAL | Status: AC
  Administered 2014-06-15: 1 via TOPICAL

## 2014-06-15 MED ORDER — RADIAPLEXRX EX GEL
Freq: Once | CUTANEOUS | Status: AC
Start: 2014-06-15 — End: 2014-06-15
  Administered 2014-06-15: 17:00:00 via TOPICAL

## 2014-06-15 NOTE — Progress Notes (Signed)
  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 . . . . . . . The patient presents for routine under treatment assessment.                                   The patient is without complaint.                                 Set-up films were reviewed.                                 The chart was checked. Physical Findings. . the lungs are clear. The heart has a regular rhythm and rate. The left breast area shows no significant radiation reaction. The patient may have a yeast infection in the sternal and inframammary fold areas Impression . . . . . . . The patient is tolerating radiation. Plan . . . . . . . . . . . . Continue treatment as planned. The patient has been given a antifungal powder to place along the sternal region and inframammary fold area.    ________________________________   Blair Promise, PhD, MD

## 2014-06-15 NOTE — Progress Notes (Signed)
Patient was given miconazole powder per Dr. Sondra Come.  She was instructed to apply it to the red/itchy area between her breast twice a day.  Sandra Costa verbalized understanding.

## 2014-06-15 NOTE — Progress Notes (Signed)
Sandra Costa has completed 1 fraction to her left breast.  She denies pain.  She is taking Percocet 2 tablets per day for lower back pain.  She reports having redness between her breast that itches.  Redness noted between her breasts.  She reports that this started a long time ago.  She was given the Radiation Therapy and You book and discussed potential side effects of fatigue and skin changes.  She was given the skin care handout, Alra Deoderant and radiaplex gel.  She was instructed to apply the radiaplex to her left breast twice a day after treatment and at bedtime.  She was encouraged to call with any questions or concerns.

## 2014-06-16 ENCOUNTER — Ambulatory Visit
Admission: RE | Admit: 2014-06-16 | Discharge: 2014-06-16 | Disposition: A | Discharge status: Home / Self Care | Payer: Non-veteran care | Source: Ambulatory Visit | Attending: Radiation Oncology | Admitting: Radiation Oncology

## 2014-06-17 ENCOUNTER — Ambulatory Visit
Admission: RE | Admit: 2014-06-17 | Discharge: 2014-06-17 | Disposition: A | Discharge status: Home / Self Care | Payer: Non-veteran care | Source: Ambulatory Visit | Attending: Radiation Oncology | Admitting: Radiation Oncology

## 2014-06-18 ENCOUNTER — Telehealth: Payer: Self-pay

## 2014-06-18 ENCOUNTER — Ambulatory Visit
Admission: RE | Admit: 2014-06-18 | Discharge: 2014-06-18 | Disposition: A | Discharge status: Home / Self Care | Payer: Non-veteran care | Source: Ambulatory Visit | Attending: Radiation Oncology | Admitting: Radiation Oncology

## 2014-06-18 NOTE — Telephone Encounter (Signed)
Bone density results received from [place].  Copy to Dr Lindi Adie.  Original to scan.

## 2014-06-18 NOTE — Telephone Encounter (Signed)
Per Dr Lindi Adie, let pt know bone density results "normal in spine, osteopenia in hip".  Dr Lindi Adie would like her to take calcium and vit d supplements and weight bearing exercise.  Pt voiced understanding.

## 2014-06-21 ENCOUNTER — Ambulatory Visit
Admission: RE | Admit: 2014-06-21 | Discharge: 2014-06-21 | Disposition: A | Discharge status: Home / Self Care | Payer: Non-veteran care | Source: Ambulatory Visit | Attending: Radiation Oncology | Admitting: Radiation Oncology

## 2014-06-22 ENCOUNTER — Encounter: Payer: Self-pay | Admitting: Radiation Oncology

## 2014-06-22 ENCOUNTER — Inpatient Hospital Stay
Admission: RE | Admit: 2014-06-22 | Discharge: 2014-06-22 | Disposition: A | Discharge status: Home / Self Care | Payer: Self-pay | Source: Ambulatory Visit | Attending: Radiation Oncology | Admitting: Radiation Oncology

## 2014-06-22 ENCOUNTER — Ambulatory Visit
Admission: RE | Admit: 2014-06-22 | Discharge: 2014-06-22 | Disposition: A | Discharge status: Home / Self Care | Payer: Non-veteran care | Source: Ambulatory Visit | Attending: Radiation Oncology | Admitting: Radiation Oncology

## 2014-06-22 VITALS — BP 123/70 | HR 79 | Temp 98.3°F | Resp 20 | Wt 202.1 lb

## 2014-06-22 DIAGNOSIS — C50512 Malignant neoplasm of lower-outer quadrant of left female breast: Secondary | ICD-10-CM

## 2014-06-22 NOTE — Progress Notes (Signed)
Weekly left breast 6 completed so far skin intact ,no skin changes noted yetm, will start using radiaplex gel today after going home, and will use bid,, patient with fklat affect, fatigued, back pain,takes percocet for that, no breast pain, appetite,  Good,  2:57 PM

## 2014-06-22 NOTE — Progress Notes (Signed)
  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 . . . . . . . The patient presents for routine under treatment assessment.                                   The patient is without complaint except for fatigue                                 Set-up films were reviewed.                                 The chart was checked. Physical Findings. . .  weight is 202 lb 1.6 oz (91.672 kg). Her oral temperature is 98.3 F (36.8 C). Her blood pressure is 123/70 and her pulse is 79. Her respiration is 20. . The lungs are clear. The heart has a regular rhythm and rate. Examination of the left breast reveals mild hyperpigmentation changes. Impression . . . . . . . The patient is tolerating radiation. Plan . . . . . . . . . . . . Continue treatment as planned.  ________________________________   Blair Promise, PhD, MD

## 2014-06-23 ENCOUNTER — Ambulatory Visit
Admission: RE | Admit: 2014-06-23 | Discharge: 2014-06-23 | Disposition: A | Discharge status: Home / Self Care | Payer: Non-veteran care | Source: Ambulatory Visit | Attending: Radiation Oncology | Admitting: Radiation Oncology

## 2014-06-24 ENCOUNTER — Ambulatory Visit
Admission: RE | Admit: 2014-06-24 | Discharge: 2014-06-24 | Disposition: A | Discharge status: Home / Self Care | Payer: Non-veteran care | Source: Ambulatory Visit | Attending: Radiation Oncology | Admitting: Radiation Oncology

## 2014-06-25 ENCOUNTER — Ambulatory Visit
Admission: RE | Admit: 2014-06-25 | Discharge: 2014-06-25 | Disposition: A | Discharge status: Home / Self Care | Payer: Non-veteran care | Source: Ambulatory Visit | Attending: Radiation Oncology | Admitting: Radiation Oncology

## 2014-06-27 ENCOUNTER — Ambulatory Visit: Payer: Non-veteran care

## 2014-06-28 ENCOUNTER — Ambulatory Visit: Payer: Non-veteran care

## 2014-06-28 ENCOUNTER — Ambulatory Visit
Admission: RE | Admit: 2014-06-28 | Discharge: 2014-06-28 | Disposition: A | Discharge status: Home / Self Care | Payer: Non-veteran care | Source: Ambulatory Visit | Attending: Radiation Oncology | Admitting: Radiation Oncology

## 2014-06-29 ENCOUNTER — Encounter: Payer: Self-pay | Admitting: Radiation Oncology

## 2014-06-29 ENCOUNTER — Ambulatory Visit: Payer: Non-veteran care

## 2014-06-29 ENCOUNTER — Ambulatory Visit
Admission: RE | Admit: 2014-06-29 | Discharge: 2014-06-29 | Disposition: A | Discharge status: Home / Self Care | Payer: Non-veteran care | Source: Ambulatory Visit | Attending: Radiation Oncology | Admitting: Radiation Oncology

## 2014-06-29 VITALS — BP 121/79 | HR 77 | Temp 98.1°F | Resp 20 | Ht 64.5 in | Wt 201.2 lb

## 2014-06-29 DIAGNOSIS — C50912 Malignant neoplasm of unspecified site of left female breast: Secondary | ICD-10-CM | POA: Diagnosis not present

## 2014-06-29 DIAGNOSIS — Z170 Estrogen receptor positive status [ER+]: Secondary | ICD-10-CM | POA: Diagnosis not present

## 2014-06-29 DIAGNOSIS — Z510 Encounter for antineoplastic radiation therapy: Secondary | ICD-10-CM | POA: Diagnosis present

## 2014-06-29 DIAGNOSIS — C50512 Malignant neoplasm of lower-outer quadrant of left female breast: Secondary | ICD-10-CM

## 2014-06-29 MED ORDER — RADIAPLEXRX EX GEL
Freq: Once | CUTANEOUS | Status: AC
  Administered 2014-06-29: 16:00:00 via TOPICAL

## 2014-06-29 NOTE — Progress Notes (Signed)
  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 . . . . . . . The patient presents for routine under treatment assessment.                                   The patient is without complaint except for some soreness along the left axillary region                                Set-up films were reviewed.                                 The chart was checked. Physical Findings. . .  height is 5' 4.5" (1.638 m) and weight is 201 lb 3.2 oz (91.264 kg). Her oral temperature is 98.1 F (36.7 C). Her blood pressure is 121/79 and her pulse is 77. Her respiration is 20. . The lungs are clear. The heart has a regular rhythm and rate. The left breast shows some hyperpigmentation changes. Impression . . . . . . . The patient is tolerating radiation. Plan . . . . . . . . . . . . Continue treatment as planned.  ________________________________   Blair Promise, PhD, MD

## 2014-06-29 NOTE — Progress Notes (Signed)
Sandra Costa has completed 11 fractions to her left breast.  She reports chronic pain in her lower back that she rates at a 4/10.  She reports fatigue but says she had it before radiation started.  She has hyperpigmentation under her left breast and under her left arm.  She reports soreness/swelling under her left arm.  She is using radiaplex gel 1-2 times per day.  She requested a refill and another tube has been given.

## 2014-06-30 ENCOUNTER — Ambulatory Visit: Payer: Non-veteran care

## 2014-06-30 ENCOUNTER — Ambulatory Visit
Admission: RE | Admit: 2014-06-30 | Discharge: 2014-06-30 | Disposition: A | Discharge status: Home / Self Care | Payer: Non-veteran care | Source: Ambulatory Visit | Attending: Radiation Oncology | Admitting: Radiation Oncology

## 2014-07-01 ENCOUNTER — Ambulatory Visit
Admission: RE | Admit: 2014-07-01 | Discharge: 2014-07-01 | Disposition: A | Discharge status: Home / Self Care | Payer: Non-veteran care | Source: Ambulatory Visit | Attending: Radiation Oncology | Admitting: Radiation Oncology

## 2014-07-01 ENCOUNTER — Ambulatory Visit: Payer: Non-veteran care

## 2014-07-02 ENCOUNTER — Ambulatory Visit: Payer: Non-veteran care

## 2014-07-02 ENCOUNTER — Ambulatory Visit
Admission: RE | Admit: 2014-07-02 | Discharge: 2014-07-02 | Disposition: A | Discharge status: Home / Self Care | Payer: Non-veteran care | Source: Ambulatory Visit | Attending: Radiation Oncology | Admitting: Radiation Oncology

## 2014-07-05 ENCOUNTER — Ambulatory Visit
Admission: RE | Admit: 2014-07-05 | Discharge: 2014-07-05 | Disposition: A | Discharge status: Home / Self Care | Payer: Non-veteran care | Source: Ambulatory Visit | Attending: Radiation Oncology | Admitting: Radiation Oncology

## 2014-07-06 ENCOUNTER — Ambulatory Visit
Admission: RE | Admit: 2014-07-06 | Discharge: 2014-07-06 | Disposition: A | Discharge status: Home / Self Care | Payer: Non-veteran care | Source: Ambulatory Visit | Attending: Radiation Oncology | Admitting: Radiation Oncology

## 2014-07-07 ENCOUNTER — Encounter: Payer: Self-pay | Admitting: Radiation Oncology

## 2014-07-07 ENCOUNTER — Ambulatory Visit
Admission: RE | Admit: 2014-07-07 | Discharge: 2014-07-07 | Disposition: A | Discharge status: Home / Self Care | Payer: Non-veteran care | Source: Ambulatory Visit | Attending: Radiation Oncology | Admitting: Radiation Oncology

## 2014-07-07 VITALS — BP 115/72 | HR 79 | Temp 98.4°F | Resp 16 | Wt 202.4 lb

## 2014-07-07 DIAGNOSIS — C50512 Malignant neoplasm of lower-outer quadrant of left female breast: Secondary | ICD-10-CM

## 2014-07-07 NOTE — Progress Notes (Signed)
  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 . . . . . . . The patient presents for routine under treatment assessment.                                   The patient is without complaint except for occasional tingling in the upper aspect of left breast                                 Set-up films were reviewed.                                 The chart was checked. Physical Findings. . .  weight is 202 lb 6.4 oz (91.808 kg). Her oral temperature is 98.4 F (36.9 C). Her blood pressure is 115/72 and her pulse is 79. Her respiration is 16. Marland Kitchen  Hyperpigmentation changes noted in the left breast but no skin breakdown. Impression . . . . . . . The patient is tolerating radiation. Plan . . . . . . . . . . . . Continue treatment as planned.  ________________________________   Blair Promise, PhD, MD

## 2014-07-07 NOTE — Progress Notes (Signed)
Weekly rad txs left breast 17 completed , slight tanning to breast,skin intact,not using her radiplex gel as yet, and stopped using powder under breast folds, occasional tingling in left breast, , appetite good, fatigued, c/o dysuria some increased frequency voiding q 2 hours,  up at night q 2 hours  2:08 PM

## 2014-07-08 ENCOUNTER — Ambulatory Visit
Admission: RE | Admit: 2014-07-08 | Discharge: 2014-07-08 | Disposition: A | Discharge status: Home / Self Care | Payer: Non-veteran care | Source: Ambulatory Visit | Attending: Radiation Oncology | Admitting: Radiation Oncology

## 2014-07-09 ENCOUNTER — Ambulatory Visit
Admission: RE | Admit: 2014-07-09 | Discharge: 2014-07-09 | Disposition: A | Discharge status: Home / Self Care | Payer: Non-veteran care | Source: Ambulatory Visit | Attending: Radiation Oncology | Admitting: Radiation Oncology

## 2014-07-11 ENCOUNTER — Encounter: Payer: Self-pay | Admitting: Radiation Oncology

## 2014-07-11 NOTE — Progress Notes (Signed)
  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 Patient's treatment planning CT scan was reviewed and she had set up of a 3 field photon boost directed at the lumpectomy cavity within the breast. The patient will be treated with 3 custom-shaped photon boost fields. A computerized isodose plan will be generated for treatment.  The patient will receive 5 additional treatments at 2 gray per fraction for a boost dose of 10 gray.  -----------------------------------  Blair Promise, PhD, MD

## 2014-07-12 ENCOUNTER — Ambulatory Visit
Admission: RE | Admit: 2014-07-12 | Discharge: 2014-07-12 | Disposition: A | Discharge status: Home / Self Care | Payer: Non-veteran care | Source: Ambulatory Visit | Attending: Radiation Oncology | Admitting: Radiation Oncology

## 2014-07-13 ENCOUNTER — Ambulatory Visit: Payer: Non-veteran care | Admitting: Hematology and Oncology

## 2014-07-13 ENCOUNTER — Ambulatory Visit
Admission: RE | Admit: 2014-07-13 | Discharge: 2014-07-13 | Disposition: A | Discharge status: Home / Self Care | Payer: Non-veteran care | Source: Ambulatory Visit | Attending: Radiation Oncology | Admitting: Radiation Oncology

## 2014-07-13 ENCOUNTER — Encounter: Payer: Self-pay | Admitting: Radiation Oncology

## 2014-07-13 VITALS — BP 129/66 | HR 77 | Temp 98.3°F | Resp 20 | Wt 205.0 lb

## 2014-07-13 DIAGNOSIS — C50512 Malignant neoplasm of lower-outer quadrant of left female breast: Secondary | ICD-10-CM

## 2014-07-13 NOTE — Progress Notes (Signed)
Patient states she has chronic back pain, takes Oxycodone prn and once daily. She states her left breast is "tender". She is applying Radiaplex to left breast for tanning, no desquamation noted. Patient denies loss of appetite, is fatigued.

## 2014-07-13 NOTE — Progress Notes (Signed)
  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 . . . . . . . The patient presents for routine under treatment assessment.                                   The patient is without complaint except for some fatigue and some soreness  in the nipple area                                 Set-up films were reviewed.                                 The chart was checked. Physical Findings. . .  weight is 205 lb (92.987 kg). Her temperature is 98.3 F (36.8 C). Her blood pressure is 129/66 and her pulse is 77. Her respiration is 20. Marland Kitchen Hyperpigmentation changes throughout the breast. No skin breakdown appreciated. Impression . . . . . . . The patient is tolerating radiation. Plan . . . . . . . . . . . . Continue treatment as planned.  ________________________________   Blair Promise, PhD, MD

## 2014-07-14 ENCOUNTER — Ambulatory Visit
Admission: RE | Admit: 2014-07-14 | Discharge: 2014-07-14 | Disposition: A | Discharge status: Home / Self Care | Payer: Non-veteran care | Source: Ambulatory Visit | Attending: Radiation Oncology | Admitting: Radiation Oncology

## 2014-07-14 ENCOUNTER — Telehealth: Payer: Self-pay | Admitting: Hematology and Oncology

## 2014-07-14 NOTE — Telephone Encounter (Signed)
s.w pt and advised on Dec appt......pt ok and aware °

## 2014-07-15 ENCOUNTER — Ambulatory Visit: Admission: RE | Admit: 2014-07-15 | Payer: Non-veteran care | Source: Ambulatory Visit | Admitting: Radiation Oncology

## 2014-07-15 ENCOUNTER — Ambulatory Visit
Admission: RE | Admit: 2014-07-15 | Discharge: 2014-07-15 | Disposition: A | Discharge status: Home / Self Care | Payer: Non-veteran care | Source: Ambulatory Visit | Attending: Radiation Oncology | Admitting: Radiation Oncology

## 2014-07-16 ENCOUNTER — Ambulatory Visit
Admission: RE | Admit: 2014-07-16 | Discharge: 2014-07-16 | Disposition: A | Discharge status: Home / Self Care | Payer: Non-veteran care | Source: Ambulatory Visit | Attending: Radiation Oncology | Admitting: Radiation Oncology

## 2014-07-19 ENCOUNTER — Ambulatory Visit
Admission: RE | Admit: 2014-07-19 | Discharge: 2014-07-19 | Disposition: A | Discharge status: Home / Self Care | Payer: Non-veteran care | Source: Ambulatory Visit | Attending: Radiation Oncology | Admitting: Radiation Oncology

## 2014-07-20 ENCOUNTER — Ambulatory Visit
Admission: RE | Admit: 2014-07-20 | Discharge: 2014-07-20 | Disposition: A | Discharge status: Home / Self Care | Payer: Non-veteran care | Source: Ambulatory Visit | Attending: Radiation Oncology | Admitting: Radiation Oncology

## 2014-07-20 ENCOUNTER — Encounter: Payer: Self-pay | Admitting: Radiation Oncology

## 2014-07-20 ENCOUNTER — Ambulatory Visit: Admission: RE | Admit: 2014-07-20 | Payer: Non-veteran care | Source: Ambulatory Visit | Admitting: Radiation Oncology

## 2014-07-20 VITALS — BP 121/75 | HR 77 | Resp 16 | Wt 204.1 lb

## 2014-07-20 DIAGNOSIS — C50512 Malignant neoplasm of lower-outer quadrant of left female breast: Secondary | ICD-10-CM

## 2014-07-20 NOTE — Progress Notes (Signed)
Weekly Management Note Current Dose: 46.8  Gy  Projected Dose: 60.4 Gy   Narrative:  The patient presents for routine under treatment assessment.  CBCT/MVCT images/Port film x-rays were reviewed.  The chart was checked. Doing well. C/o back pain (chronic). Some breast "aching" . Using radiaplex.  Physical Findings: Weight: 204 lb 1.6 oz (92.579 kg). Hyper pigmentation (worse under inframammary fold but skin is intact there.)  Impression:  The patient is tolerating radiation.  Plan:  Continue treatment as planned. Continue radiaplex.

## 2014-07-20 NOTE — Progress Notes (Signed)
Weight and vitals stable. Denies pain. Hyperpigmentation of left/treated breast noted without desquamation. Reports using radiaplex bid as directed. Reports mild fatigue.

## 2014-07-21 ENCOUNTER — Ambulatory Visit
Admission: RE | Admit: 2014-07-21 | Discharge: 2014-07-21 | Disposition: A | Discharge status: Home / Self Care | Payer: Non-veteran care | Source: Ambulatory Visit | Attending: Radiation Oncology | Admitting: Radiation Oncology

## 2014-07-23 ENCOUNTER — Ambulatory Visit: Payer: Non-veteran care

## 2014-07-26 ENCOUNTER — Ambulatory Visit: Payer: Non-veteran care

## 2014-07-26 ENCOUNTER — Ambulatory Visit
Admission: RE | Admit: 2014-07-26 | Discharge: 2014-07-26 | Disposition: A | Discharge status: Home / Self Care | Payer: Non-veteran care | Source: Ambulatory Visit | Attending: Radiation Oncology | Admitting: Radiation Oncology

## 2014-07-27 ENCOUNTER — Encounter: Payer: Self-pay | Admitting: Radiation Oncology

## 2014-07-27 ENCOUNTER — Ambulatory Visit: Payer: Non-veteran care

## 2014-07-27 ENCOUNTER — Ambulatory Visit
Admission: RE | Admit: 2014-07-27 | Discharge: 2014-07-27 | Disposition: A | Discharge status: Home / Self Care | Payer: Non-veteran care | Source: Ambulatory Visit | Attending: Radiation Oncology | Admitting: Radiation Oncology

## 2014-07-27 VITALS — BP 133/70 | HR 88 | Temp 98.2°F | Resp 16 | Ht 64.5 in | Wt 205.8 lb

## 2014-07-27 DIAGNOSIS — C50512 Malignant neoplasm of lower-outer quadrant of left female breast: Secondary | ICD-10-CM

## 2014-07-27 NOTE — Progress Notes (Signed)
Sandra Costa has completed 29 fractions to her left breast.  She denies pain.  She reports fatigue.  The skin on her left breast has hyperpigmentation and she has peeling underneath her left breast.  She is using radiaplex twice a day and miconazole powder underneath her breast prn.

## 2014-07-27 NOTE — Progress Notes (Signed)
  Radiation Oncology         [phone] ________________________________  Name: Sandra Costa MRN: [id number]  Date: 07/27/2014  DOB: Apr 20, 1951  Simulation Verification Note   Status: outpatient  NARRATIVE: The patient was brought to the treatment unit and placed in the planned treatment position. The clinical setup was verified. Then port films were obtained and uploaded to the radiation oncology medical record software.  The treatment beams were carefully compared against the planned radiation fields. The position location and shape of the radiation fields was reviewed. They targeted volume of tissue appears to be appropriately covered by the radiation beams. Organs at risk appear to be excluded as planned.  Based on my personal review, I approved the simulation verification. The patient's treatment will proceed as planned.  -----------------------------------  Blair Promise, PhD, MD

## 2014-07-27 NOTE — Progress Notes (Signed)
  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 . . . . . . . The patient presents for routine under treatment assessment.                                   The patient is without complaint except for some mild fatigue as well as some mild itching and discomfort within the breast area                                 Set-up films were reviewed.                                 The chart was checked. Physical Findings. . .  height is 5' 4.5" (1.638 m) and weight is 205 lb 12.8 oz (93.35 kg). Her oral temperature is 98.2 F (36.8 C). Her blood pressure is 133/70 and her pulse is 88. Her respiration is 16. . The left breast area shows hyperpigmentation changes and some dry desquamation in the inframammary fold. No moist desquamation Impression . . . . . . . The patient is tolerating radiation. Plan . . . . . . . . . . . . Continue treatment as planned.  ________________________________   Blair Promise, PhD, MD

## 2014-07-28 ENCOUNTER — Ambulatory Visit
Admission: RE | Admit: 2014-07-28 | Discharge: 2014-07-28 | Disposition: A | Discharge status: Home / Self Care | Payer: Non-veteran care | Source: Ambulatory Visit | Attending: Radiation Oncology | Admitting: Radiation Oncology

## 2014-07-29 ENCOUNTER — Ambulatory Visit
Admission: RE | Admit: 2014-07-29 | Discharge: 2014-07-29 | Disposition: A | Discharge status: Home / Self Care | Payer: Non-veteran care | Source: Ambulatory Visit | Attending: Radiation Oncology | Admitting: Radiation Oncology

## 2014-07-30 ENCOUNTER — Ambulatory Visit: Payer: Non-veteran care

## 2014-07-30 ENCOUNTER — Encounter: Payer: Self-pay | Admitting: *Deleted

## 2014-07-30 ENCOUNTER — Ambulatory Visit
Admission: RE | Admit: 2014-07-30 | Discharge: 2014-07-30 | Disposition: A | Discharge status: Home / Self Care | Payer: Non-veteran care | Source: Ambulatory Visit | Attending: Radiation Oncology | Admitting: Radiation Oncology

## 2014-07-30 DIAGNOSIS — C50512 Malignant neoplasm of lower-outer quadrant of left female breast: Secondary | ICD-10-CM

## 2014-07-30 MED ORDER — RADIAPLEXRX EX GEL
Freq: Once | CUTANEOUS | Status: AC
  Administered 2014-07-30: 14:00:00 via TOPICAL

## 2014-08-02 ENCOUNTER — Ambulatory Visit
Admission: RE | Admit: 2014-08-02 | Discharge: 2014-08-02 | Disposition: A | Discharge status: Home / Self Care | Payer: Non-veteran care | Source: Ambulatory Visit | Attending: Radiation Oncology | Admitting: Radiation Oncology

## 2014-08-02 ENCOUNTER — Encounter: Payer: Self-pay | Admitting: Radiation Oncology

## 2014-08-02 DIAGNOSIS — C50512 Malignant neoplasm of lower-outer quadrant of left female breast: Secondary | ICD-10-CM

## 2014-08-02 NOTE — Progress Notes (Signed)
Weekly Management Note:  Site: Left breast Current Dose:  6040  cGy Projected Dose: 6040  cGy including left breast boost  Narrative: The patient is seen today for routine under treatment assessment. CBCT/MVCT images/port films were reviewed. The chart was reviewed.   She has been using Radioplex gel for a dry desquamation along the inframammary region.  She finishes her radiation therapy today.  Physical Examination:  Filed Vitals:   08/02/14 1418  BP: 132/68  Pulse: 84  Temp: 98.9 F (37.2 C)  Resp: 16  .  Weight: 201 lb 6.4 oz (91.354 kg).  There is hyperpigmentation the skin with dry desquamation along the left inframammary region.  No areas of moist desquamation.  Impression: Radiation therapy completed.  Plan: Follow visit with Dr. Sondra Come in one month.

## 2014-08-02 NOTE — Progress Notes (Signed)
Sandra Costa has completed treatment to her left breast with 33 fractions.  She denies pain.  She reports fatigue.  The skin on her left breast has hyperpigmentation with areas of peeling underneath her left breast.  She is using radiaplex twice a day and miconazole powder as needed.  She has been given a one month follow up card.

## 2014-08-05 ENCOUNTER — Telehealth: Payer: Self-pay | Admitting: Hematology and Oncology

## 2014-08-05 ENCOUNTER — Ambulatory Visit (HOSPITAL_BASED_OUTPATIENT_CLINIC_OR_DEPARTMENT_OTHER): Payer: Non-veteran care | Admitting: Hematology and Oncology

## 2014-08-05 DIAGNOSIS — Z170 Estrogen receptor positive status [ER+]: Secondary | ICD-10-CM

## 2014-08-05 DIAGNOSIS — C50512 Malignant neoplasm of lower-outer quadrant of left female breast: Secondary | ICD-10-CM

## 2014-08-05 MED ORDER — ANASTROZOLE 1 MG PO TABS: 1.0000 mg | ORAL_TABLET | Freq: Every day | ORAL | Status: AC

## 2014-08-05 NOTE — Progress Notes (Signed)
Patient Care Team: Niger Reid, MD as PCP - General (Internal Medicine)  DIAGNOSIS: Breast cancer of lower-outer quadrant of left female breast   Staging form: Breast, AJCC 7th Edition     Clinical: No stage assigned - Unsigned     Pathologic: Stage IA (T1a, N0, cM0) - Signed by Rulon Eisenmenger, MD on 05/17/2014   SUMMARY OF ONCOLOGIC HISTORY:   Breast cancer of lower-outer quadrant of left female breast   01/27/2014 Mammogram 9 mm hypoechoic mass upper-inner quadrant of left breast   03/26/2014 Surgery Left breast lumpectomy 0.5 cm tumor 3 lymph nodes negative ER 90% PR 50% HER-2 negative (at [facility])   06/15/2014 - 08/02/2014 Radiation Therapy Adjuvant radiation therapy    Anti-estrogen oral therapy Patient was provided with prescription for Arimidex 1 mg daily. She plans to start in January 2016. But she was very reluctant to take this.    CHIEF COMPLIANT: followup after radiation therapy  INTERVAL HISTORY: Sandra Costa is a 70 left maxillary with above-mentioned history of left-sided breast cancer treated with lumpectomy followed by radiation therapy. She is here today after the conclusion of radiation treatment to discuss antiestrogen therapy options. She is had some skin reaction to radiation but is healing from that.  REVIEW OF SYSTEMS:   Constitutional: Denies fevers, chills or abnormal weight loss Eyes: Denies blurriness of vision Ears, nose, mouth, throat, and face: Denies mucositis or sore throat Respiratory: Denies cough, dyspnea or wheezes Cardiovascular: Denies palpitation, chest discomfort or lower extremity swelling Gastrointestinal:  Denies nausea, heartburn or change in bowel habits Skin: Denies abnormal skin rashes Lymphatics: Denies new lymphadenopathy or easy bruising Neurological:Denies numbness, tingling or new weaknesses Behavioral/Psych: Mood is stable, no new changes  Breast: discomfort in the left breast from radiation therapy. All other systems were  reviewed with the patient and are negative.  I have reviewed the past medical history, past surgical history, social history and family history with the patient and they are unchanged from previous note.  ALLERGIES:  is allergic to morphine and related; penicillins; sulfa antibiotics; and tape.  MEDICATIONS:  Current Outpatient Prescriptions  Medication Sig Dispense Refill  . ARIPiprazole (ABILIFY) 10 MG tablet Take 30 mg by mouth daily. Take 1/2 tab daily    . aspirin 81 MG tablet Take 81 mg by mouth daily.    . hyaluronate sodium (RADIAPLEXRX) GEL Apply 1 application topically once. Tube given 07/30/14    . hydrochlorothiazide (HYDRODIURIL) 25 MG tablet Take 12.5 mg by mouth daily.     . miconazole (MICOTIN) 2 % powder Apply topically 2 (two) times daily as needed for itching.    . non-metallic deodorant Jethro Poling) MISC Apply 1 application topically daily as needed.    Marland Kitchen oxyCODONE-acetaminophen (PERCOCET/ROXICET) 5-325 MG per tablet Take 1 tablet by mouth every 6 (six) hours as needed for pain.    . potassium chloride SA (K-DUR,KLOR-CON) 20 MEQ tablet Take 20 mEq by mouth daily.    . simvastatin (ZOCOR) 20 MG tablet Take 20 mg by mouth every evening.    Marland Kitchen anastrozole (ARIMIDEX) 1 MG tablet Take 1 tablet (1 mg total) by mouth daily. 90 tablet 3   No current facility-administered medications for this visit.    PHYSICAL EXAMINATION: ECOG PERFORMANCE STATUS: 1 - Symptomatic but completely ambulatory  Filed Vitals:   08/05/14 1534  BP: 127/76  Pulse: 88  Temp: 99 F (37.2 C)  Resp: 20   Filed Weights   08/05/14 1534  Weight: 202 lb  8 oz (91.853 kg)    GENERAL:alert, no distress and comfortable SKIN: skin color, texture, turgor are normal, no rashes or significant lesions EYES: normal, Conjunctiva are pink and non-injected, sclera clear OROPHARYNX:no exudate, no erythema and lips, buccal mucosa, and tongue normal  NECK: supple, thyroid normal size, non-tender, without  nodularity LYMPH:  no palpable lymphadenopathy in the cervical, axillary or inguinal LUNGS: clear to auscultation and percussion with normal breathing effort HEART: regular rate & rhythm and no murmurs and no lower extremity edema ABDOMEN:abdomen soft, non-tender and normal bowel sounds Musculoskeletal:no cyanosis of digits and no clubbing  NEURO: alert & oriented x 3 with fluent speech, no focal motor/sensory deficits BREAST:left breast desquamation seen.  LABORATORY DATA:  I have reviewed the data as listed   Chemistry      Component Value Date/Time   NA 142 04/02/2013 1820   K 3.3* 04/02/2013 1820   CL 103 04/02/2013 1820   CO2 29 04/02/2013 1820   BUN 17 04/02/2013 1820   CREATININE 0.80 04/02/2013 1820      Component Value Date/Time   CALCIUM 10.0 04/02/2013 1820       ASSESSMENT & PLAN:  Breast cancer of lower-outer quadrant of left female breast Left breast invasive ductal carcinoma T1 B. N0 M0 stage IA ER/PR positive HER-2 negative status post lumpectomy at [facility] in [facility] status post radiation therapy.  Treatment plan: Arimidex 1 mg daily is what I recommended to her. The patient was initially reluctant to take it and almost better from that she would not take this antiestrogen treatment but after much counseling and discussion, she decided to at least try it and see if she tolerates it. She would like to start after the holidays are completed. I will see her back in 3 months after starting treatment to assess any side effects to antiestrogen therapy.  Return to clinic in April 2016 for followup.   No orders of the defined types were placed in this encounter.   The patient has a good understanding of the overall plan. she agrees with it. She will call with any problems that may develop before her next visit here.   Rulon Eisenmenger, MD 08/05/2014 4:50 PM

## 2014-08-05 NOTE — Assessment & Plan Note (Signed)
Left breast invasive ductal carcinoma T1 B. N0 M0 stage IA ER/PR positive HER-2 negative status post lumpectomy at [facility] in [facility] status post radiation therapy.  Treatment plan: Arimidex 1 mg daily is what I recommended to her. The patient was initially reluctant to take it and almost better from that she would not take this antiestrogen treatment but after much counseling and discussion, she decided to at least try it and see if she tolerates it. She would like to start after the holidays are completed. I will see her back in 3 months after starting treatment to assess any side effects to antiestrogen therapy.  Return to clinic in April 2016 for followup.

## 2014-08-09 ENCOUNTER — Encounter: Payer: Self-pay | Admitting: Radiation Oncology

## 2014-08-09 NOTE — Progress Notes (Signed)
  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 Patient was treated with a combination of 6 and 10 megavoltage photons. The lumpectomy cavity boost with a 3 field photon boost using a combination of 10 and 6 megavoltage photons  Narrative: The patient tolerated radiation treatment relatively well.   She had minimal itching and discomfort within the breast and some fatigue, no moist desquamation.  Plan: The patient has completed radiation treatment. The patient will return to radiation oncology clinic for routine followup in one month. I advised them to call or return sooner if they have any questions or concerns related to their recovery or treatment.  -----------------------------------  Blair Promise, PhD, MD

## 2014-08-30 ENCOUNTER — Ambulatory Visit: Payer: Non-veteran care | Admitting: Radiation Oncology

## 2014-08-30 ENCOUNTER — Encounter: Payer: Self-pay | Admitting: Radiation Oncology

## 2014-08-30 ENCOUNTER — Ambulatory Visit
Admission: RE | Admit: 2014-08-30 | Discharge: 2014-08-30 | Disposition: A | Discharge status: Home / Self Care | Payer: Non-veteran care | Source: Ambulatory Visit | Attending: Radiation Oncology | Admitting: Radiation Oncology

## 2014-08-30 VITALS — BP 135/62 | HR 76 | Temp 98.2°F | Resp 16 | Ht 64.5 in | Wt 210.8 lb

## 2014-08-30 DIAGNOSIS — C50512 Malignant neoplasm of lower-outer quadrant of left female breast: Secondary | ICD-10-CM

## 2014-08-30 NOTE — Progress Notes (Signed)
Sandra Costa where with her daughter for follow up after treatment to her left breast.  Her appointment was originally for Wednesday, but they thought it was for today.  She denies pain.  She reports she is going to start taking Arimidex today.  She was given a prescription on 08/05/14 by Dr. Lindi Adie and was instructed to start taking it in a month. The skin on her left breast and underarm area has hyperpigmentation, is dry and peeling.  Advised her to use cocoa butter or vitamin E lotion.  She reports fatigue.

## 2014-08-30 NOTE — Progress Notes (Signed)
Radiation Oncology         [phone] ________________________________  Name: Sandra Costa MRN: [id number]  Date: 08/30/2014  DOB: 10-06-1950  Follow-Up Visit Note  CC: Hilbert Corrigan, MD  Reid, Niger, MD  Diagnosis:   Left-sided breast cancer  Interval Since Last Radiation:  Approximately one month   Narrative:  The patient returns today for routine follow-up.  The patient came today although she is a patient of Dr.  Sondra Come who is in  [place]. She indicated that she did wish to be seen by one of his partners today. She has done well overall since she finished treatment. The patient's skin has healed significantly since she completed her course of radiation treatment. She has begun anti-hormonal treatment - starting this week.                               ALLERGIES:  is allergic to morphine and related; penicillins; sulfa antibiotics; and tape.  Meds: Current Outpatient Prescriptions  Medication Sig Dispense Refill  . ARIPiprazole (ABILIFY) 10 MG tablet Take 30 mg by mouth daily. Take 1/2 tab daily    . aspirin 81 MG tablet Take 81 mg by mouth daily.    . hydrochlorothiazide (HYDRODIURIL) 25 MG tablet Take 12.5 mg by mouth daily.     Marland Kitchen oxyCODONE-acetaminophen (PERCOCET/ROXICET) 5-325 MG per tablet Take 1 tablet by mouth every 6 (six) hours as needed for pain.    . potassium chloride SA (K-DUR,KLOR-CON) 20 MEQ tablet Take 20 mEq by mouth daily.    . simvastatin (ZOCOR) 20 MG tablet Take 20 mg by mouth every evening.    Marland Kitchen anastrozole (ARIMIDEX) 1 MG tablet Take 1 tablet (1 mg total) by mouth daily. (Patient not taking: Reported on 08/30/2014) 90 tablet 3  . hyaluronate sodium (RADIAPLEXRX) GEL Apply 1 application topically once. Tube given 07/30/14    . miconazole (MICOTIN) 2 % powder Apply topically 2 (two) times daily as needed for itching.    . non-metallic deodorant Jethro Poling) MISC Apply 1 application topically daily as needed.     No current facility-administered medications for this encounter.     Physical Findings: The patient is in no acute distress. Patient is alert and oriented.  height is 5' 4.5" (1.638 m) and weight is 210 lb 12.8 oz (95.618 kg). Her oral temperature is 98.2 F (36.8 C). Her blood pressure is 135/62 and her pulse is 76. Her respiration is 16. .   The skin in the treatment area has healed satisfactorily, no areas of concern/moist desquamation/poor healing. Some continued dryness is present with hyperpigmentation.  Lab Findings: No results found for: WBC, HGB, HCT, MCV, PLT   Radiographic Findings: No results found.  Impression:    The patient has done satisfactorily since finishing treatment. She has begun anti-hormonal treatment.  Plan:  The patient will followup in our clinic in 3 months with Dr. Sondra Come.

## 2014-09-01 ENCOUNTER — Ambulatory Visit: Payer: Non-veteran care | Admitting: Radiation Oncology

## 2014-10-11 ENCOUNTER — Telehealth: Payer: Self-pay

## 2014-10-11 NOTE — Telephone Encounter (Signed)
Called pt to confirm she had started arimidex.  Pt reports she has.

## 2014-12-10 ENCOUNTER — Telehealth: Payer: Self-pay

## 2014-12-10 NOTE — Telephone Encounter (Signed)
Sandra Costa called to cancel her appointment with Dr. Lindi Adie on 12-13-14.  She does not want to reschedule.  She stated that she did not make the appointment for 12-13-14.  She received the automated phone message reminderfor the appointment. Sandra Costa states that the [facility] will not pay for her visits here. Asked if she had an oncologist at the [facility] who would fill her Arimidex.  She stated yes.  Her oncologist's name is  Dr. Ysidro Evert  Force. Will cancel appointment and forward this message to Dr. Geralyn Flash nurse.

## 2014-12-14 ENCOUNTER — Ambulatory Visit: Payer: Non-veteran care | Admitting: Hematology and Oncology

## 2015-09-21 ENCOUNTER — Emergency Department (HOSPITAL_BASED_OUTPATIENT_CLINIC_OR_DEPARTMENT_OTHER)
Admission: EM | Admit: 2015-09-21 | Discharge: 2015-09-21 | Disposition: A | Discharge status: Home / Self Care | Payer: Medicare Other | Attending: Emergency Medicine | Admitting: Emergency Medicine

## 2015-09-21 ENCOUNTER — Encounter (HOSPITAL_BASED_OUTPATIENT_CLINIC_OR_DEPARTMENT_OTHER): Payer: Self-pay

## 2015-09-21 DIAGNOSIS — Z8659 Personal history of other mental and behavioral disorders: Secondary | ICD-10-CM | POA: Insufficient documentation

## 2015-09-21 DIAGNOSIS — G8929 Other chronic pain: Secondary | ICD-10-CM

## 2015-09-21 DIAGNOSIS — I10 Essential (primary) hypertension: Secondary | ICD-10-CM | POA: Insufficient documentation

## 2015-09-21 DIAGNOSIS — M533 Sacrococcygeal disorders, not elsewhere classified: Secondary | ICD-10-CM | POA: Insufficient documentation

## 2015-09-21 DIAGNOSIS — Z853 Personal history of malignant neoplasm of breast: Secondary | ICD-10-CM | POA: Insufficient documentation

## 2015-09-21 DIAGNOSIS — Z880 Allergy status to penicillin: Secondary | ICD-10-CM | POA: Diagnosis not present

## 2015-09-21 DIAGNOSIS — M549 Dorsalgia, unspecified: Secondary | ICD-10-CM

## 2015-09-21 DIAGNOSIS — Z79899 Other long term (current) drug therapy: Secondary | ICD-10-CM | POA: Diagnosis not present

## 2015-09-21 DIAGNOSIS — M545 Low back pain: Secondary | ICD-10-CM | POA: Diagnosis present

## 2015-09-21 DIAGNOSIS — Z7982 Long term (current) use of aspirin: Secondary | ICD-10-CM | POA: Diagnosis not present

## 2015-09-21 DIAGNOSIS — R109 Unspecified abdominal pain: Secondary | ICD-10-CM | POA: Insufficient documentation

## 2015-09-21 DIAGNOSIS — Z87891 Personal history of nicotine dependence: Secondary | ICD-10-CM | POA: Insufficient documentation

## 2015-09-21 LAB — URINALYSIS, ROUTINE W REFLEX MICROSCOPIC
Bilirubin Urine: NEGATIVE
Glucose, UA: NEGATIVE mg/dL
Hgb urine dipstick: NEGATIVE
KETONES UR: NEGATIVE mg/dL
NITRITE: NEGATIVE
PH: 7 (ref 5.0–8.0)
Protein, ur: NEGATIVE mg/dL
Specific Gravity, Urine: 1.017 (ref 1.005–1.030)

## 2015-09-21 LAB — URINE MICROSCOPIC-ADD ON

## 2015-09-21 MED ORDER — DIAZEPAM 2 MG PO TABS
2.0000 mg | ORAL_TABLET | Freq: Once | ORAL | Status: AC
  Administered 2015-09-21: 2 mg via ORAL
  Filled 2015-09-21: qty 1

## 2015-09-21 MED ORDER — HYDROMORPHONE HCL 1 MG/ML IJ SOLN
0.5000 mg | Freq: Once | INTRAMUSCULAR | Status: AC
  Administered 2015-09-21: 0.5 mg via INTRAMUSCULAR
  Filled 2015-09-21: qty 1

## 2015-09-21 MED ORDER — KETOROLAC TROMETHAMINE 30 MG/ML IJ SOLN
15.0000 mg | Freq: Once | INTRAMUSCULAR | Status: AC
  Administered 2015-09-21: 15 mg via INTRAMUSCULAR
  Filled 2015-09-21: qty 1

## 2015-09-21 NOTE — ED Provider Notes (Signed)
CSN: [id number]     Arrival date & time 09/21/15  1919 History  By signing my name below, I, Doran Stabler, attest that this documentation has been prepared under the direction and in the presence of Deno Etienne, DO. Electronically Signed: Doran Stabler, ED Scribe. 09/21/2015. 10:12 PM.    Chief Complaint  Patient presents with  . Back Pain   The history is provided by the patient. No language interpreter was used.   HPI Comments: Sandra Costa is a 65 y.o. female with a PMHx of chronic back pain and HTN who presents to the Emergency Department complaining of constant, chronic ongoing back pain that radiates to the LLE for the past 1 month that worsened, PTA. Pt denies any exacerbating or alleviating factors. Pt took Valium PTA with no relief. Pt also reports abdominal pain. Pt denies any bladder/bowel incontinence, urgency, fevers.  Pt denies any recent injuries. Pt recently had a UTI and just finished her abx today.   Past Medical History  Diagnosis Date  . Chronic back pain   . Hypertension   . Breast cancer, left breast ([place])   . Personality disorder    Past Surgical History  Procedure Laterality Date  . Back surgery    . Abdominal hysterectomy    . Cholecystectomy    . Apendectomy    . Breast lumpectomy with axillary lymph node biopsy Left 03/16/14   Family History  Problem Relation Age of Onset  . Breast cancer Mother   . Breast cancer Sister   . Breast cancer Maternal Grandmother   . Breast cancer Sister    Social History  Substance Use Topics  . Smoking status: Former Smoker -- 0.25 packs/day for 25 years    Types: Cigarettes    Quit date: 06/02/2009  . Smokeless tobacco: Never Used  . Alcohol Use: No   OB History    No data available     Review of Systems  Constitutional: Negative for fever and chills.  HENT: Negative for congestion and rhinorrhea.   Eyes: Negative for redness and visual disturbance.  Respiratory: Negative for shortness of breath and wheezing.    Cardiovascular: Negative for chest pain and palpitations.  Gastrointestinal: Positive for abdominal distention. Negative for nausea and vomiting.  Genitourinary: Negative for dysuria and urgency.  Musculoskeletal: Positive for back pain. Negative for myalgias and arthralgias.  Skin: Negative for pallor and wound.  Neurological: Negative for dizziness and headaches.      Allergies  Morphine and related; Penicillins; Sulfa antibiotics; and Tape  Home Medications   Prior to Admission medications   Medication Sig Start Date End Date Taking? Authorizing Provider  anastrozole (ARIMIDEX) 1 MG tablet Take 1 tablet (1 mg total) by mouth daily. Patient not taking: Reported on 08/30/2014 08/05/14   Nicholas Lose, MD  ARIPiprazole (ABILIFY) 10 MG tablet Take 30 mg by mouth daily. Take 1/2 tab daily    Historical Provider, MD  aspirin 81 MG tablet Take 81 mg by mouth daily.    Historical Provider, MD  hydrochlorothiazide (HYDRODIURIL) 25 MG tablet Take 12.5 mg by mouth daily.     Historical Provider, MD  miconazole (MICOTIN) 2 % powder Apply topically 2 (two) times daily as needed for itching.    Historical Provider, MD  non-metallic deodorant Jethro Poling) MISC Apply 1 application topically daily as needed.    Historical Provider, MD  oxyCODONE-acetaminophen (PERCOCET/ROXICET) 5-325 MG per tablet Take 1 tablet by mouth every 6 (six) hours as needed for pain.  Historical Provider, MD  potassium chloride SA (K-DUR,KLOR-CON) 20 MEQ tablet Take 20 mEq by mouth daily.    Historical Provider, MD  simvastatin (ZOCOR) 20 MG tablet Take 20 mg by mouth every evening.    Historical Provider, MD   BP 161/90 mmHg  Pulse 110  Temp(Src) 98.3 F (36.8 C) (Oral)  Resp 18  Ht 5\' 4"  (1.626 m)  Wt 225 lb (102.059 kg)  BMI 38.60 kg/m2  SpO2 97% Physical Exam  Constitutional: She is oriented to person, place, and time. She appears well-developed and well-nourished. No distress.  HENT:  Head: Normocephalic and  atraumatic.  Eyes: EOM are normal. Pupils are equal, round, and reactive to light.  Neck: Normal range of motion. Neck supple.  Cardiovascular: Normal rate and regular rhythm.  Exam reveals no gallop and no friction rub.   No murmur heard. Pulmonary/Chest: Effort normal. She has no wheezes. She has no rales.  Abdominal: Soft. She exhibits no distension. There is no tenderness.  Musculoskeletal: She exhibits no edema or tenderness.  Pt points to the left sacrum-lilac as most tender, not reproducible on exam, 2+ reflexes to her right patellar and achilles, intact pulse motor and sensation to bilateral extremities. 5/5 strength in all extremities.    Neurological: She is alert and oriented to person, place, and time.  Skin: Skin is warm and dry. She is not diaphoretic.  Psychiatric: She has a normal mood and affect. Her behavior is normal.  Nursing note and vitals reviewed.   ED Course  Procedures  DIAGNOSTIC STUDIES: Oxygen Saturation is 99% on room air, normal by my interpretation.    COORDINATION OF CARE: 9:32 PM Will order Urinalysis.  Discussed treatment plan with pt at bedside and pt agreed to plan.   Labs Review Labs Reviewed  URINALYSIS, ROUTINE W REFLEX MICROSCOPIC (NOT AT [facility]) - Abnormal; Notable for the following:    Leukocytes, UA TRACE (*)    All other components within normal limits  URINE MICROSCOPIC-ADD ON - Abnormal; Notable for the following:    Squamous Epithelial / LPF 0-5 (*)    Bacteria, UA RARE (*)    All other components within normal limits    Imaging Review No results found. I have personally reviewed and evaluated these images and lab results as part of my medical decision-making.   EKG Interpretation None      MDM   Final diagnoses:  Chronic back pain    65 yo F with a chief complaint of left-sided low back pain. This is a chronic issue for this patient. She was supposed to have an MRI today but became claustrophobic during the exam and had  to stop. Patient feels that her pain got worse when she got home. Denies any acute injury. Patient with good strength able to ambulate without difficulty. Will treat the patient's pain here. We'll have her follow-up with her pain clinic and her spinal doctor which she has appointments with tomorrow.  I personally performed the services described in this documentation, which was scribed in my presence. The recorded information has been reviewed and is accurate.   11:13 PM:  I have discussed the diagnosis/risks/treatment options with the patient and family and believe the pt to be eligible for discharge home to follow-up with pain mgmt, spinal doc. We also discussed returning to the ED immediately if new or worsening sx occur. We discussed the sx which are most concerning (e.g., sudden worsening pain, fever,cauda equina) that necessitate immediate return. Medications administered to  the patient during their visit and any new prescriptions provided to the patient are listed below.  Medications given during this visit Medications  diazepam (VALIUM) tablet 2 mg (2 mg Oral Given 09/21/15 2240)  HYDROmorphone (DILAUDID) injection 0.5 mg (0.5 mg Intramuscular Given 09/21/15 2241)  ketorolac (TORADOL) 30 MG/ML injection 15 mg (15 mg Intramuscular Given 09/21/15 2241)    Discharge Medication List as of 09/21/2015 10:48 PM      The patient appears reasonably screen and/or stabilized for discharge and I doubt any other medical condition or other [facility] requiring further screening, evaluation, or treatment in the ED at this time prior to discharge.     Deno Etienne, DO 09/21/15 2313

## 2015-09-21 NOTE — ED Notes (Signed)
Pt approached front desk and stated that she forgot to mention to the triage RN that she has had a bladder infection recently. Pt in NAD, reassured she will be taken back when a bed is available.

## 2015-09-21 NOTE — ED Notes (Signed)
Chronic lower left back pain that radiates down leg as well as malodorous urine.  Pt states the percocet that she takes for her back is not helping with her pain.  No incontinence, pt able to ambulate with minimal assistance.

## 2015-09-21 NOTE — Discharge Instructions (Signed)

## 2015-09-21 NOTE — ED Notes (Signed)
C/o lower back pain that radiates down left leg x 1 month-denies injury-slow steady gait-states she was to have MRI today from [place] order but unable to tolerate exam

## 2015-09-21 NOTE — ED Notes (Signed)
Pt and husband verbalize understanding of d/c instructions and deny any further needs at this time.  Pt dropped a piece of paper prior to leaving and was able to bend down to floor without apparent difficulty or handicap.  Mobility seems to have improved since arrival.

## 2015-10-28 ENCOUNTER — Other Ambulatory Visit: Payer: Self-pay | Admitting: Internal Medicine

## 2015-10-28 DIAGNOSIS — M545 Low back pain: Secondary | ICD-10-CM

## 2015-11-06 ENCOUNTER — Ambulatory Visit
Admission: RE | Admit: 2015-11-06 | Discharge: 2015-11-06 | Disposition: A | Discharge status: Home / Self Care | Payer: Medicare Other | Source: Ambulatory Visit | Attending: Internal Medicine | Admitting: Internal Medicine

## 2015-11-06 DIAGNOSIS — M545 Low back pain: Secondary | ICD-10-CM

## 2015-12-11 ENCOUNTER — Ambulatory Visit: Payer: Non-veteran care

## 2017-04-11 IMAGING — MR MR LUMBAR SPINE W/O CM
5 series · 44 of 48 positions shown · non-contrast
Comparison: Abdominal pelvic CT 02/12/2016.

CLINICAL DATA: Chronic back pain for years. Bilateral leg pain,
weakness and numbness in the right leg. History of back surgery in
0441. History of breast cancer. No recent injury.

EXAM:
MRI LUMBAR SPINE WITHOUT CONTRAST
TECHNIQUE: Multiplanar, multisequence MR imaging of the lumbar spine was
performed. No intravenous contrast was administered.

[Series 7: STIR · sagittal · 4.0mm · 0.88mm/px · 6 of 12 slices shown]
[im 1/12]
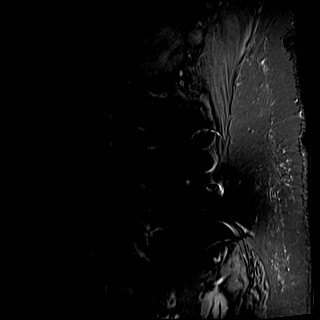
[im 3/12]
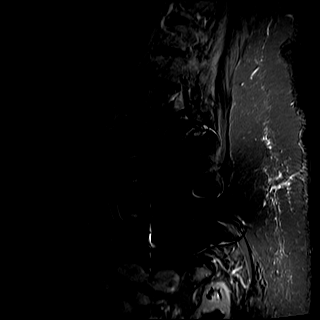
[im 5/12]
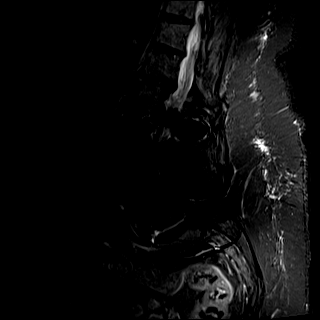
[im 7/12]
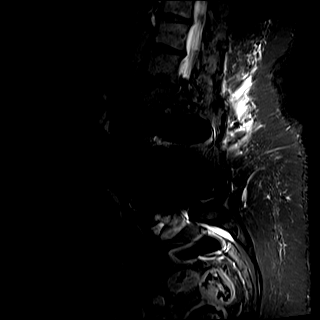
[im 9/12]
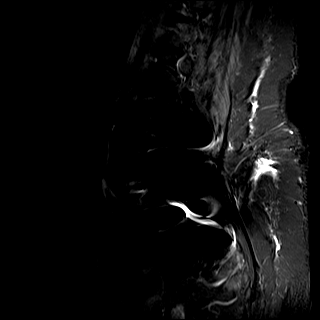
[im 12/12]
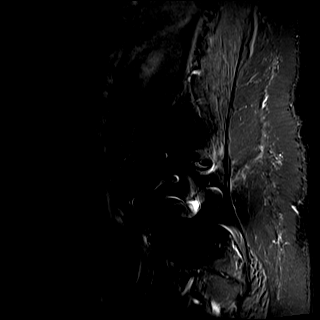

[Series 8: T2 · sagittal · 4.0mm · 0.73mm/px · 6 of 13 slices shown (1 of 2)]
[im 1/13]
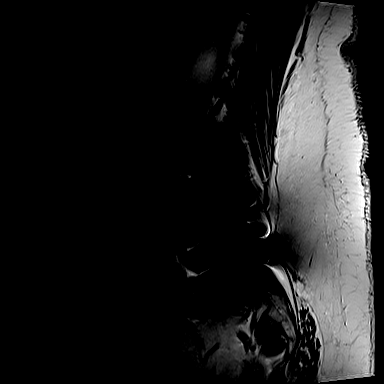
[im 3/13]
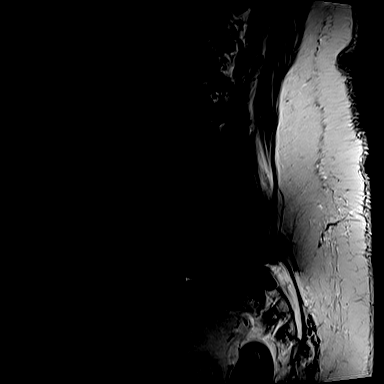
[im 5/13]
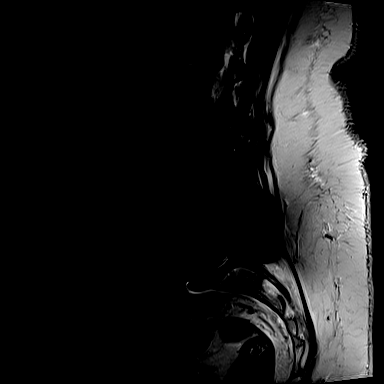
[im 8/13]
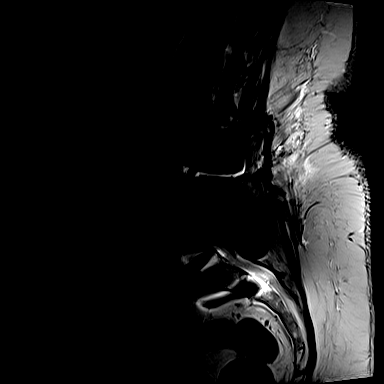
[im 10/13]
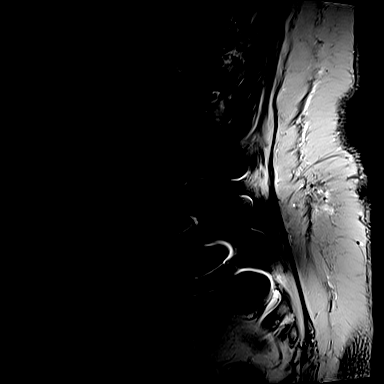
[im 13/13]
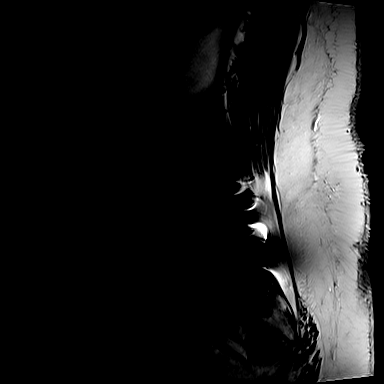

[Series 10: T2 · axial · 4.0mm · 0.59mm/px · z∈[-50,+173]mm · 15 of 30 slices shown (2 of 2)]
[im 1/30]
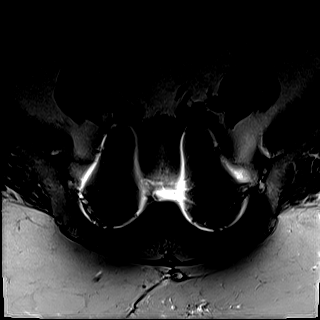
[im 3/30]
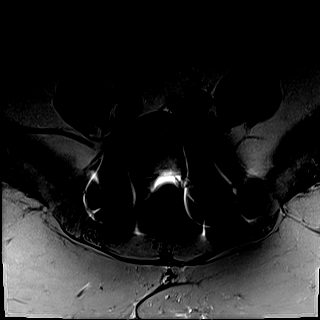
[im 5/30]
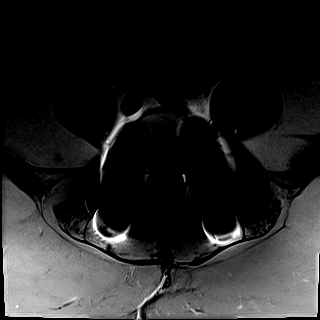
[im 7/30]
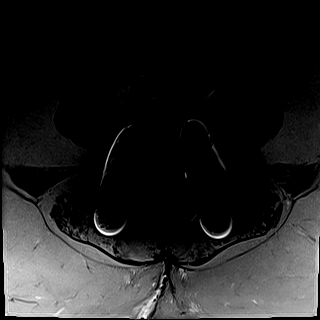
[im 9/30]
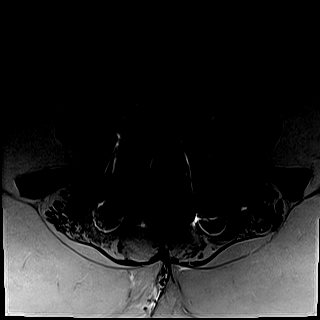
[im 11/30]
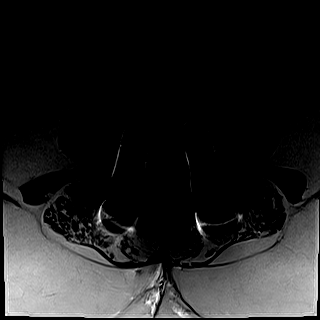
[im 13/30]
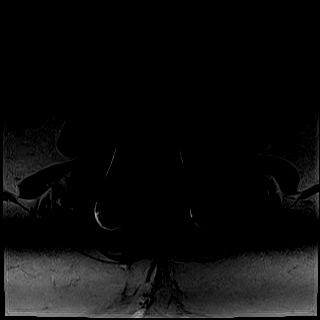
[im 15/30]
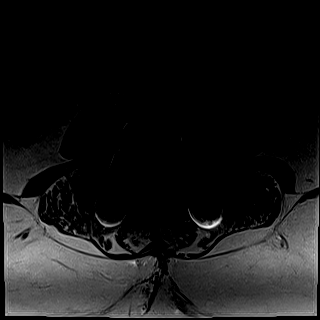
[im 17/30]
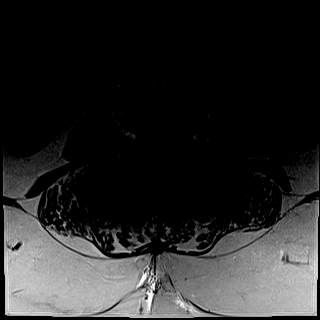
[im 19/30]
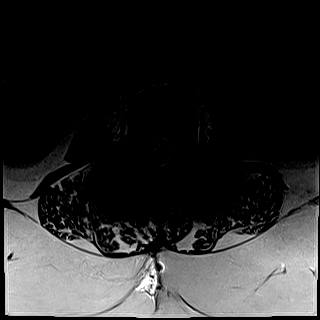
[im 21/30]
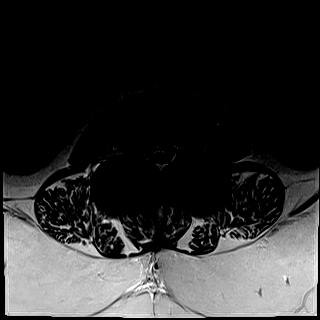
[im 23/30]
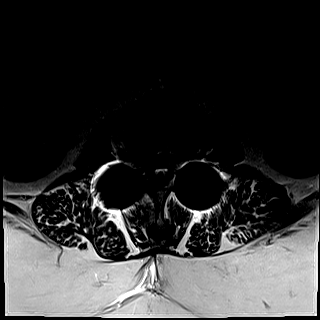
[im 25/30]
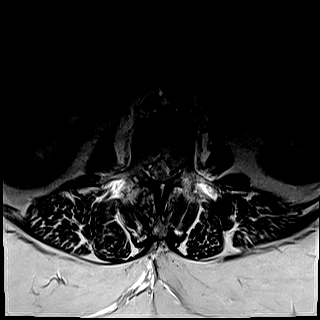
[im 27/30]
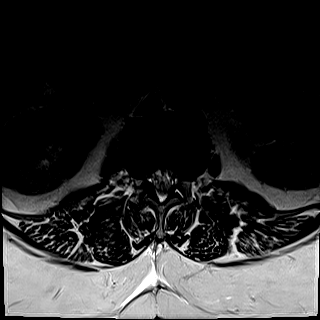
[im 30/30]
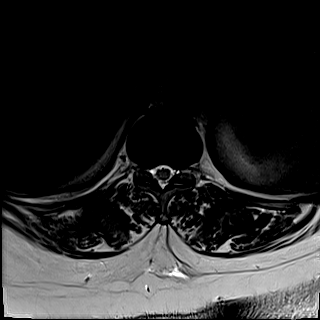

[Series 11: T1 · sagittal · 4.0mm · 0.88mm/px · 6 of 13 slices shown (1 of 2)]
[im 1/13]
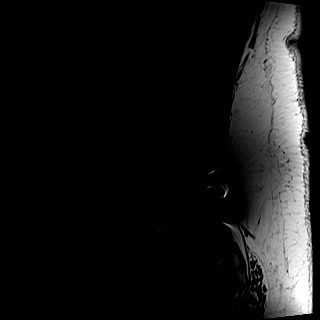
[im 3/13]
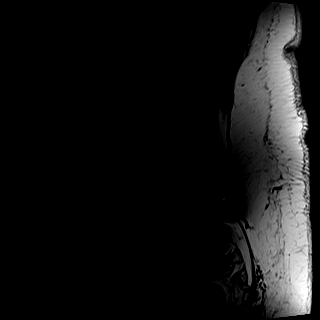
[im 5/13]
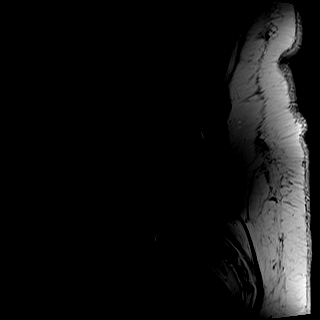
[im 8/13]
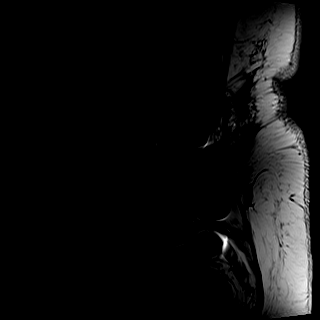
[im 10/13]
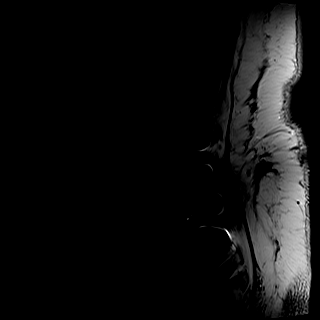
[im 13/13]
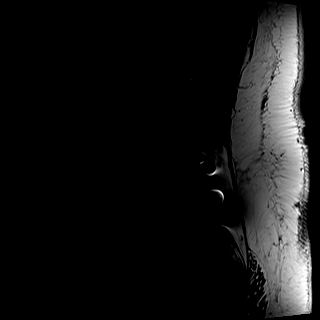

[Series 12: T1 · axial · 4.0mm · 0.74mm/px · z∈[-50,+173]mm · 11 of 30 slices shown (2 of 2)]
[im 1/30]
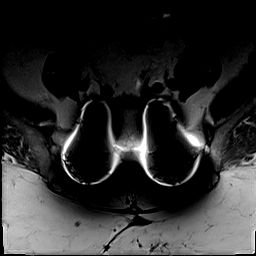
[im 3/30]
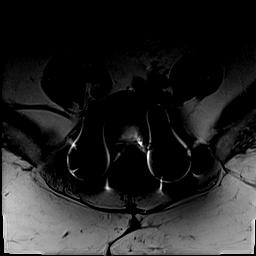
[im 5/30]
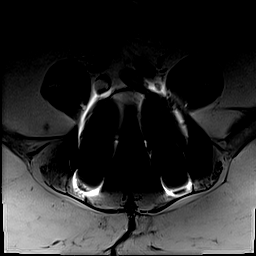
[im 7/30]
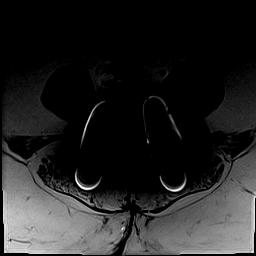
[im 9/30]
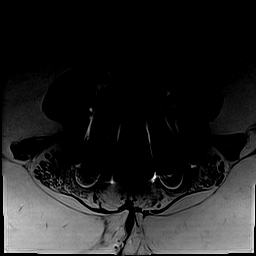
[im 13/30]
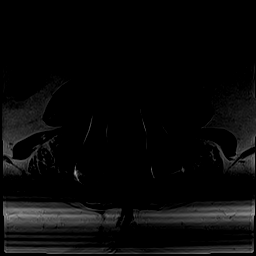
[im 15/30]
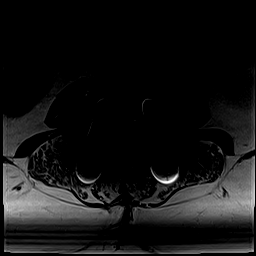
[im 17/30]
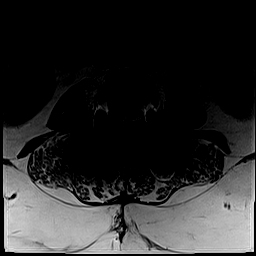
[im 21/30]
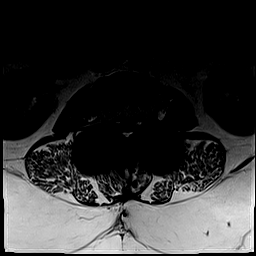
[im 25/30]
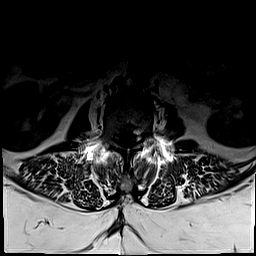
[im 30/30]
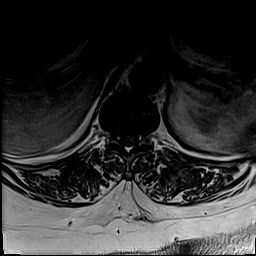

[44 of 48 positions shown; findings below may reference images not displayed]

FINDINGS: Metal artifact reduction sequences were performed. However, there is
significant metallic susceptibility artifact related to lower lumbar
fusion, partially obscuring the lower lumbar spine.

Segmentation: Conventional anatomy assumed, with the last open disc
space designated L5-S1.

Alignment:  Normal.

Bones: Status post laminectomy with pedicle screw fusion bilaterally
from L4 through S1. The surgical hardware creates significant
artifact. No acute or worrisome osseous findings are demonstrated.
There are endplate degenerative changes at L1-2 and L2-3. The
visualized sacroiliac joints appear unremarkable.

Conus medullaris: Extends to the L1-2 level and appears normal.

Paraspinal and other soft tissues: No significant paraspinal
findings. There is some edema within the posterior subcutaneous fat.

Disc levels:

There is mild disc bulging at T11-12 and T12-L1 without resulting
significant spinal stenosis or nerve root encroachment.

L1-2: Loss of disc height with annular disc bulging eccentric to the
left. Mild inferior foraminal narrowing on the left without definite
L1 nerve root encroachment.

L2-3: Annular disc bulging, facet and ligamentous hypertrophy
contribute to moderate spinal stenosis. The lateral recesses and
foramina demonstrate mild to moderate narrowing. Foraminal
assessment is limited by artifact.

L3-4: This level is largely obscured by artifact from the surgical
hardware. There is annular disc bulging, facet and ligamentous
hypertrophy contributing to moderate spinal stenosis. Foraminal
assessment is limited.

L4-5: The findings at this level are nearly completely obscured by
artifact.

L5-S1: Disc height and hydration are maintained. The foramina are
nearly completely obscured by artifact.
IMPRESSION: 1. Status post laminectomy and pedicle screw fusion from L4 through
S1. The surgical hardware creates significant artifact, limiting
evaluation of the lower 3 disc space levels.
2. Moderate multifactorial spinal stenosis at L2-3 and L3-4. Some
foraminal narrowing is suspected at both levels, not optimally
evaluated due to artifact.
3. Consider further evaluation with CT or CT myelography.

## 2018-11-26 DEATH — deceased
